# Patient Record
Sex: Female | Born: 1995 | Race: White | Hispanic: No | Marital: Single | Smoking: Current some day smoker
Health system: Southern US, Community
[De-identification: ages and names within clinical notes are randomized; demographics above are authoritative.]

## PROBLEM LIST (undated history)

## (undated) ENCOUNTER — Inpatient Hospital Stay (HOSPITAL_COMMUNITY): Payer: Self-pay

## (undated) DIAGNOSIS — F32A Depression, unspecified: Secondary | ICD-10-CM

## (undated) DIAGNOSIS — F1190 Opioid use, unspecified, uncomplicated: Secondary | ICD-10-CM

## (undated) DIAGNOSIS — F1910 Other psychoactive substance abuse, uncomplicated: Secondary | ICD-10-CM

## (undated) DIAGNOSIS — Z8614 Personal history of Methicillin resistant Staphylococcus aureus infection: Secondary | ICD-10-CM

## (undated) DIAGNOSIS — L309 Dermatitis, unspecified: Secondary | ICD-10-CM

## (undated) DIAGNOSIS — F419 Anxiety disorder, unspecified: Secondary | ICD-10-CM

## (undated) DIAGNOSIS — F909 Attention-deficit hyperactivity disorder, unspecified type: Secondary | ICD-10-CM

## (undated) DIAGNOSIS — F329 Major depressive disorder, single episode, unspecified: Secondary | ICD-10-CM

## (undated) DIAGNOSIS — R51 Headache: Secondary | ICD-10-CM

## (undated) HISTORY — DX: Attention-deficit hyperactivity disorder, unspecified type: F90.9

## (undated) SURGERY — Surgical Case
Anesthesia: Regional

---
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
## 2014-03-14 ENCOUNTER — Other Ambulatory Visit: Payer: Self-pay | Admitting: Obstetrics and Gynecology

## 2014-03-14 DIAGNOSIS — O09299 Supervision of pregnancy with other poor reproductive or obstetric history, unspecified trimester: Secondary | ICD-10-CM

## 2014-03-15 ENCOUNTER — Ambulatory Visit (INDEPENDENT_AMBULATORY_CARE_PROVIDER_SITE_OTHER): Payer: Medicaid Other

## 2014-03-15 ENCOUNTER — Other Ambulatory Visit: Payer: Self-pay | Admitting: Obstetrics and Gynecology

## 2014-03-15 DIAGNOSIS — O0932 Supervision of pregnancy with insufficient antenatal care, second trimester: Secondary | ICD-10-CM

## 2014-03-15 DIAGNOSIS — O09299 Supervision of pregnancy with other poor reproductive or obstetric history, unspecified trimester: Secondary | ICD-10-CM

## 2014-03-15 DIAGNOSIS — O0930 Supervision of pregnancy with insufficient antenatal care, unspecified trimester: Secondary | ICD-10-CM

## 2014-03-15 NOTE — Progress Notes (Signed)
U/S(18+6wks)-Limited u/s for dates-active fetus, FHR-153 bpm, meas c/w LMP dates, cx appears closed, bilateral adnexa appears WNL, anterior Gr 0 placenta, female fetus, will return for complete anatomy survey

## 2014-03-21 ENCOUNTER — Encounter: Payer: Self-pay | Admitting: Advanced Practice Midwife

## 2014-03-21 ENCOUNTER — Ambulatory Visit (INDEPENDENT_AMBULATORY_CARE_PROVIDER_SITE_OTHER): Payer: Medicaid Other | Admitting: Advanced Practice Midwife

## 2014-03-21 VITALS — BP 94/58 | Wt 134.0 lb

## 2014-03-21 DIAGNOSIS — O0930 Supervision of pregnancy with insufficient antenatal care, unspecified trimester: Secondary | ICD-10-CM

## 2014-03-21 DIAGNOSIS — Z36 Encounter for antenatal screening of mother: Secondary | ICD-10-CM

## 2014-03-21 DIAGNOSIS — Z1159 Encounter for screening for other viral diseases: Secondary | ICD-10-CM

## 2014-03-21 DIAGNOSIS — Z0189 Encounter for other specified special examinations: Secondary | ICD-10-CM

## 2014-03-21 DIAGNOSIS — O0932 Supervision of pregnancy with insufficient antenatal care, second trimester: Secondary | ICD-10-CM | POA: Insufficient documentation

## 2014-03-21 DIAGNOSIS — Z1389 Encounter for screening for other disorder: Secondary | ICD-10-CM

## 2014-03-21 DIAGNOSIS — Z1371 Encounter for nonprocreative screening for genetic disease carrier status: Secondary | ICD-10-CM

## 2014-03-21 DIAGNOSIS — O09899 Supervision of other high risk pregnancies, unspecified trimester: Secondary | ICD-10-CM | POA: Insufficient documentation

## 2014-03-21 DIAGNOSIS — F17200 Nicotine dependence, unspecified, uncomplicated: Secondary | ICD-10-CM

## 2014-03-21 DIAGNOSIS — Z3490 Encounter for supervision of normal pregnancy, unspecified, unspecified trimester: Secondary | ICD-10-CM

## 2014-03-21 DIAGNOSIS — O09299 Supervision of pregnancy with other poor reproductive or obstetric history, unspecified trimester: Secondary | ICD-10-CM | POA: Insufficient documentation

## 2014-03-21 DIAGNOSIS — Z331 Pregnant state, incidental: Secondary | ICD-10-CM

## 2014-03-21 DIAGNOSIS — Z3482 Encounter for supervision of other normal pregnancy, second trimester: Secondary | ICD-10-CM

## 2014-03-21 DIAGNOSIS — Z0184 Encounter for antibody response examination: Secondary | ICD-10-CM

## 2014-03-21 DIAGNOSIS — O09292 Supervision of pregnancy with other poor reproductive or obstetric history, second trimester: Secondary | ICD-10-CM

## 2014-03-21 LAB — POCT URINALYSIS DIPSTICK
Glucose, UA: NEGATIVE
Ketones, UA: NEGATIVE
LEUKOCYTES UA: NEGATIVE
Nitrite, UA: NEGATIVE
PROTEIN UA: NEGATIVE
RBC UA: NEGATIVE

## 2014-03-21 LAB — CBC
HCT: 37.8 % (ref 36.0–46.0)
Hemoglobin: 12.8 g/dL (ref 12.0–15.0)
MCH: 29.9 pg (ref 26.0–34.0)
MCHC: 33.9 g/dL (ref 30.0–36.0)
MCV: 88.3 fL (ref 78.0–100.0)
PLATELETS: 290 10*3/uL (ref 150–400)
RBC: 4.28 MIL/uL (ref 3.87–5.11)
RDW: 13.8 % (ref 11.5–15.5)
WBC: 12.6 10*3/uL — AB (ref 4.0–10.5)

## 2014-03-21 MED ORDER — PRENATAL VITAMINS 0.8 MG PO TABS: 1.0000 | ORAL_TABLET | Freq: Every day | ORAL | Status: DC

## 2014-03-21 NOTE — Progress Notes (Signed)
Subjective:    Carmen Martin is a [id number] [redacted]w[redacted]d being seen today for her first obstetrical visit.  Her obstetrical history is significant for IUFD somewhere around 30-32 weeks. Had IOL and SVD w/o complications.  Pregnancy history fully reviewed. She is late to care this pregnancy at 18 weeks.  Patient reports fatigue.  Has cut back to 1/2 ppweek of cigarettes (form 1 ppd) over the past 2 weeks  Filed Vitals:   03-23-14 1428  BP: 94/58  Weight: 134 lb (60.782 kg)    HISTORY: OB History  Gravida Para Term Preterm AB SAB TAB Ectopic Multiple Living     # Outcome Date GA Lbr Len/2nd Weight Sex Delivery Anes PTL Lv  3 CUR           2 PRE 01/23/12 [redacted]w[redacted]d 05:00 / 00:03 2 lb (0.907 kg) M SVD EPI  SB  1 SAB              Past Medical History  Diagnosis Date  . Anxiety   . Headache(784.0)   . Depression   . ADD (attention deficit disorder)   . Hx MRSA infection     in buttocks  . ADHD (attention deficit hyperactivity disorder) 11/02/2012   Past Surgical History  Procedure Laterality Date  . No past surgeries    . Wisdom tooth extraction  2014  . Colonoscopy with propofol N/A 09/29/2012    Procedure: COLONOSCOPY WITH PROPOFOL;  Surgeon: Malissa Hippo, MD;  Location: [facility];  Service: Endoscopy;  Laterality: N/A;  at cecum at 0902 total withdrawal time   Family History  Problem Relation Age of Onset  . Diabetes Mother   . Heart disease Mother   . Stroke Mother   . Hypertension Mother   . Depression Father   . Cancer Maternal Grandmother     breast     Exam                                           Skin: normal coloration and turgor, no rashes    Neurologic: oriented, normal, normal mood   Extremities: normal strength, tone, and muscle mass   HEENT PERRLA   Mouth/Teeth mucous membranes moist, poor dentition   Neck supple and no masses   Cardiovascular: regular rate and rhythm   Respiratory:  appears well, vitals normal, no  respiratory distress, acyanotic   Abdomen: soft, non-tender;  FHR: 150          Assessment:    Pregnancy: [id number] Patient Active Problem List   Diagnosis Date Noted  . Pregnant 23-Mar-2014  . Late prenatal care in second trimester 03-23-14  . History of intrauterine fetal death, currently pregnant 03-23-2014  . ADHD (attention deficit hyperactivity disorder) 11/02/2012  . Anxiety state, unspecified 09/22/2012  . Depression 09/22/2012  . Rectal bleeding 09/22/2012  . Nausea 09/22/2012  . Abdominal pain, epigastric 09/22/2012        Plan:     Initial labs drawn. Continue prenatal vitamins  Problem list reviewed and updated  Smoking cessation discussed:  Will probably try e cigs Reviewed recommended weight gain based on pre-gravid BMI  Encouraged well-balanced diet Genetic Screening discussed Quad Screen: requested.  Ultrasound discussed; fetal survey: requested.  Follow up 9/29 as scheduled for anatomy scan.  CRESENZO-DISHMAN,Berneita Sanagustin 2014/03/23

## 2014-03-22 LAB — TSH: TSH: 0.729 u[IU]/mL (ref 0.350–4.500)

## 2014-03-22 LAB — URINALYSIS, ROUTINE W REFLEX MICROSCOPIC
BILIRUBIN URINE: NEGATIVE
GLUCOSE, UA: NEGATIVE mg/dL
Hgb urine dipstick: NEGATIVE
KETONES UR: NEGATIVE mg/dL
Leukocytes, UA: NEGATIVE
Nitrite: NEGATIVE
PH: 6.5 (ref 5.0–8.0)
Protein, ur: NEGATIVE mg/dL
SPECIFIC GRAVITY, URINE: 1.027 (ref 1.005–1.030)
Urobilinogen, UA: 1 mg/dL (ref 0.0–1.0)

## 2014-03-22 LAB — DRUG SCREEN, URINE, NO CONFIRMATION
AMPHETAMINE SCRN UR: NEGATIVE
BARBITURATE QUANT UR: NEGATIVE
Benzodiazepines.: POSITIVE — AB
COCAINE METABOLITES: NEGATIVE
Creatinine,U: 403.4 mg/dL
MARIJUANA METABOLITE: NEGATIVE
METHADONE: NEGATIVE
OPIATE SCREEN, URINE: NEGATIVE
Phencyclidine (PCP): NEGATIVE
Propoxyphene: NEGATIVE

## 2014-03-22 LAB — AFP, QUAD SCREEN
AFP: 60.8 ng/mL
CURR GEST AGE: 19.5 wks.days
HCG, Total: 11.22 IU/mL
INH: 167.6 pg/mL
Interpretation-AFP: NEGATIVE
MOM FOR INH: 0.83
MoM for AFP: 1.03
MoM for hCG: 0.46
Open Spina bifida: NEGATIVE
Tri 18 Scr Risk Est: NEGATIVE
UE3 VALUE: 2.34 ng/mL
uE3 Mom: 1.35

## 2014-03-22 LAB — CYSTIC FIBROSIS DIAGNOSTIC STUDY

## 2014-03-22 LAB — RPR

## 2014-03-22 LAB — HIV ANTIBODY (ROUTINE TESTING W REFLEX): HIV 1&2 Ab, 4th Generation: NONREACTIVE

## 2014-03-22 LAB — ANTIBODY SCREEN: ANTIBODY SCREEN: NEGATIVE

## 2014-03-22 LAB — OXYCODONE SCREEN, UA, RFLX CONFIRM: Oxycodone Screen, Ur: NEGATIVE ng/mL

## 2014-03-22 LAB — RUBELLA SCREEN: RUBELLA: 1.14 {index} — AB (ref <0.90)

## 2014-03-22 LAB — VARICELLA ZOSTER ANTIBODY, IGG: VARICELLA IGG: 39.46 {index} (ref <135.00)

## 2014-03-22 LAB — HEPATITIS B SURFACE ANTIGEN: Hepatitis B Surface Ag: NEGATIVE

## 2014-03-23 ENCOUNTER — Encounter: Payer: Self-pay | Admitting: Advanced Practice Midwife

## 2014-03-23 LAB — URINE CULTURE
Colony Count: NO GROWTH
ORGANISM ID, BACTERIA: NO GROWTH

## 2014-03-27 ENCOUNTER — Other Ambulatory Visit: Payer: Self-pay | Admitting: Advanced Practice Midwife

## 2014-03-27 DIAGNOSIS — O09299 Supervision of pregnancy with other poor reproductive or obstetric history, unspecified trimester: Secondary | ICD-10-CM

## 2014-03-30 ENCOUNTER — Inpatient Hospital Stay (HOSPITAL_COMMUNITY)
Admission: AD | Admit: 2014-03-30 | Discharge: 2014-03-30 | Disposition: A | Discharge status: Home / Self Care | Payer: Medicaid Other | Source: Ambulatory Visit | Attending: Obstetrics & Gynecology | Admitting: Obstetrics & Gynecology

## 2014-03-30 ENCOUNTER — Encounter (HOSPITAL_COMMUNITY): Payer: Self-pay

## 2014-03-30 DIAGNOSIS — M545 Low back pain, unspecified: Secondary | ICD-10-CM | POA: Diagnosis not present

## 2014-03-30 DIAGNOSIS — R109 Unspecified abdominal pain: Secondary | ICD-10-CM

## 2014-03-30 DIAGNOSIS — O26899 Other specified pregnancy related conditions, unspecified trimester: Secondary | ICD-10-CM

## 2014-03-30 DIAGNOSIS — Z3490 Encounter for supervision of normal pregnancy, unspecified, unspecified trimester: Secondary | ICD-10-CM

## 2014-03-30 DIAGNOSIS — O99330 Smoking (tobacco) complicating pregnancy, unspecified trimester: Secondary | ICD-10-CM | POA: Diagnosis not present

## 2014-03-30 DIAGNOSIS — O0932 Supervision of pregnancy with insufficient antenatal care, second trimester: Secondary | ICD-10-CM

## 2014-03-30 DIAGNOSIS — O99891 Other specified diseases and conditions complicating pregnancy: Secondary | ICD-10-CM | POA: Diagnosis not present

## 2014-03-30 DIAGNOSIS — O09299 Supervision of pregnancy with other poor reproductive or obstetric history, unspecified trimester: Secondary | ICD-10-CM

## 2014-03-30 DIAGNOSIS — O9989 Other specified diseases and conditions complicating pregnancy, childbirth and the puerperium: Principal | ICD-10-CM

## 2014-03-30 LAB — URINALYSIS, ROUTINE W REFLEX MICROSCOPIC
Bilirubin Urine: NEGATIVE
Glucose, UA: NEGATIVE mg/dL
Hgb urine dipstick: NEGATIVE
Ketones, ur: NEGATIVE mg/dL
LEUKOCYTES UA: NEGATIVE
Nitrite: NEGATIVE
PROTEIN: NEGATIVE mg/dL
Specific Gravity, Urine: 1.015 (ref 1.005–1.030)
UROBILINOGEN UA: 0.2 mg/dL (ref 0.0–1.0)
pH: 7 (ref 5.0–8.0)

## 2014-03-30 LAB — LIPASE, BLOOD: Lipase: 15 U/L (ref 11–59)

## 2014-03-30 LAB — CBC
HCT: 32.9 % — ABNORMAL LOW (ref 36.0–46.0)
Hemoglobin: 11.1 g/dL — ABNORMAL LOW (ref 12.0–15.0)
MCH: 30.2 pg (ref 26.0–34.0)
MCHC: 33.7 g/dL (ref 30.0–36.0)
MCV: 89.4 fL (ref 78.0–100.0)
PLATELETS: 190 10*3/uL (ref 150–400)
RBC: 3.68 MIL/uL — ABNORMAL LOW (ref 3.87–5.11)
RDW: 13.5 % (ref 11.5–15.5)
WBC: 12.2 10*3/uL — AB (ref 4.0–10.5)

## 2014-03-30 MED ORDER — CYCLOBENZAPRINE HCL 5 MG PO TABS: 5.0000 mg | ORAL_TABLET | Freq: Once | ORAL | Status: DC

## 2014-03-30 MED ORDER — CYCLOBENZAPRINE HCL 5 MG PO TABS
5.0000 mg | ORAL_TABLET | Freq: Once | ORAL | Status: DC
  Filled 2014-03-30: qty 1

## 2014-03-30 NOTE — MAU Provider Note (Signed)
First Provider Initiated Contact with Patient 03/30/14 [phone]      Chief Complaint:  Abdominal Pain, Shoulder Pain and Back Pain   Carmen Martin is  18 y.o. G3P0110 at [redacted]w[redacted]d presents complaining of Abdominal Pain, Shoulder Pain and Back Pain Pain woke the patient up at 1:45 due to abdominal and back pain.  The pain is located in the lower abdomen bilaterally and lower back bilaterally.  Abdominal pain is sharp and pinching in nature.  Standing makes the pain worse. She notes the pain was a 7-8/10 but has now improved to a 5/10. The pain is now only on the right side. She denies N/V. Last ate last night. No dysuria, urinary urgency, urinary frequency. No vaginal discharge/pruritus. No contractions.  Additionally, the patient endorses back pain that is an 8/10 constantly which started last night. It is worsened by movement/bending over. No injury/trauma.   She had a few sharp pains between her shoulder blades last night that resolved quickly. No current pains. Denies SOB or chest pain.   Patient notes fetal movement. No LOF, vaginal bleeding.  Obstetrical/Gynecological History: OB History   Grav Para Term Preterm Abortions TAB SAB Ect Mult Living       Past Medical History: Past Medical History  Diagnosis Date  . Anxiety   . Headache(784.0)   . Depression   . ADD (attention deficit disorder)   . Hx MRSA infection     in buttocks  . ADHD (attention deficit hyperactivity disorder) 11/02/2012    Past Surgical History: Past Surgical History  Procedure Laterality Date  . No past surgeries    . Wisdom tooth extraction  2014  . Colonoscopy with propofol N/A 09/29/2012    Procedure: COLONOSCOPY WITH PROPOFOL;  Surgeon: Malissa Hippo, MD;  Location: [facility];  Service: Endoscopy;  Laterality: N/A;  at cecum at 0902 total withdrawal time    Family History: Family History  Problem Relation Age of Onset  . Diabetes Mother   . Heart disease Mother   .  Stroke Mother   . Hypertension Mother   . Depression Father   . Cancer Maternal Grandmother     breast    Social History: History  Substance Use Topics  . Smoking status: Current Some Day Smoker -- 0.50 packs/day    Types: Cigarettes    Last Attempt to Quit: 07/20/2011  . Smokeless tobacco: Never Used     Comment: pt states less than 1/2 pack cig per week  . Alcohol Use: No    Allergies:  Allergies  Allergen Reactions  . Biaxin [Clarithromycin] Anaphylaxis and Swelling    Face swelling, thresh, throat closes up    Meds:  Prescriptions prior to admission  Medication Sig Dispense Refill  . Prenatal Multivit-Min-Fe-FA (PRENATAL VITAMINS) 0.8 MG tablet Take 1 tablet by mouth daily.  30 tablet  12  . Prenatal Vit-Fe Fumarate-FA (MULTIVITAMIN-PRENATAL) 27-0.8 MG TABS tablet Take 1 tablet by mouth daily at 12 noon.        Review of Systems -   Review of Systems  Constitutional: Negative for fever, chills, weight loss, malaise/fatigue and diaphoresis.  HENT: Negative for hearing loss, ear pain, nosebleeds, congestion, sore throat, neck pain, tinnitus and ear discharge.   Eyes: Negative for blurred vision, double vision, photophobia, pain, discharge and redness.  Respiratory: Negative for cough, hemoptysis, sputum production, shortness of breath, wheezing and stridor.   Cardiovascular: Negative for chest pain,  palpitations, orthopnea,  leg swelling  Gastrointestinal: Negative for heartburn, nausea, vomiting, diarrhea, constipation, blood in stool Genitourinary: Negative for dysuria, urgency, frequency, hematuria and flank pain.  Musculoskeletal: Negative for myalgias, back pain, joint pain and falls.  Skin: Negative for itching and rash.  Neurological: Negative for dizziness, tingling, tremors, sensory change, speech change, focal weakness, seizures, loss of consciousness, weakness and headaches.  Endo/Heme/Allergies: Negative for environmental allergies and polydipsia. Does not  bruise/bleed easily.  Psychiatric/Behavioral: Negative for depression, suicidal ideas, hallucinations, memory loss and substance abuse. The patient is not nervous/anxious and does not have insomnia.     Physical Exam  Blood pressure 91/54, pulse 92, temperature 98.5 F (36.9 C), temperature source Oral, resp. rate 18, last menstrual period 11/03/2013, SpO2 97.00%. GENERAL: Well-developed, well-nourished female in no acute distress.  LUNGS: Clear to auscultation bilaterally.  HEART: Regular rate and rhythm. ABDOMEN: Soft, nondistended, gravid.  During the exam, the patient does not appear to be in too much distress, however she notes pain periumbilically and on the right with palpation of the lower left abdomen. Possible rebound, but once again this is only by patient endorsement- does not appear to be in too much distress. No guarding. No pain in the epigastrium.  BACK: No spinal drop-offs.  EXTREMITIES: Nontender, no edema, 2+ distal pulses. DTR's 2+ FHT:  Baseline rate 150 bpm    Labs: Results for orders placed during the hospital encounter of 03/30/14 (from the past 24 hour(s))  URINALYSIS, ROUTINE W REFLEX MICROSCOPIC   Collection Time    03/30/14  4:15 AM      Result Value Ref Range   Color, Urine YELLOW  YELLOW   APPearance HAZY (*) CLEAR   Specific Gravity, Urine 1.015  1.005 - 1.030   pH 7.0  5.0 - 8.0   Glucose, UA NEGATIVE  NEGATIVE mg/dL   Hgb urine dipstick NEGATIVE  NEGATIVE   Bilirubin Urine NEGATIVE  NEGATIVE   Ketones, ur NEGATIVE  NEGATIVE mg/dL   Protein, ur NEGATIVE  NEGATIVE mg/dL   Urobilinogen, UA 0.2  0.0 - 1.0 mg/dL   Nitrite NEGATIVE  NEGATIVE   Leukocytes, UA NEGATIVE  NEGATIVE   Imaging Studies:  US Ob Limited  03/29/2014   DATING AND VIABILITY SONOGRAM   Carmen Martin is a 18 y.o. year old G37P0130 with LMP 11/03/2013 which  would correlate to  [redacted]w[redacted]d weeks gestation.  She has regular menstrual  cycles.   She is here today for a confirmatory initial  sonogram.    GESTATION: SINGLETON     FETAL ACTIVITY:          Heart rate         153 bpm          The fetus is active.  PLACENTA LOCALIZATION:  anterior GRADE 0  CERVIX: Appears long and closed   ADNEXA: The ovaries are normal.  GESTATIONAL AGE AND  BIOMETRICS:  Gestational criteria: Estimated Date of Delivery: 08/10/14 by LMP now at  [redacted]w[redacted]d  Previous Scans:0  GESTATIONAL SAC            mm          weeks  CROWN RUMP LENGTH            mm          weeks  NUCHAL TRANSLUCENCY            mm           BIPARIETAL DIAMETER  4.27 cm         18+6 weeks  HEAD CIRCUMFERENCE           15.71 cm         18+4 weeks  ABDOMINAL CIRCUMFERENCE           13.36 cm         18+6 weeks  FEMUR LENGTH           2.68 cm         18+1 weeks                                                           AVERAGE EGA(BY THIS SCAN):   18+4 weeks                                                 ESTIMATED FETAL WEIGHT:        243  grams,  WORKING EDD( LMP ):  08/10/2014   TECHNICIAN COMMENTS:  U/S(18+6wks)-Limited u/s for dates-active fetus, FHR-153 bpm, meas c/w LMP  dates, cx appears closed, bilateral adnexa appears WNL, anterior Gr 0  placenta, female fetus, will return for complete anatomy survey  A copy of this report including all images has been saved and backed up to  a second source for retrieval if needed. All measures and details of the  anatomical scan, placentation, fluid volume and pelvic anatomy are  contained in that report.  Chari Manning 03/15/2014 3:14 PM  Clinical Impression and recommendations:  I have reviewed the sonogram results above, combined with the patient's  current clinical course, below are my impressions and any appropriate  recommendations for management based on the sonographic findings.   1.  [id number] Estimated Date of Delivery: 08/10/14  by today's sonographic  dating 2.  Normal fetal sonographic findings,  3.  Normal general sonographic findings  Recommend routine prenatal care based on this sonogram or as clinically   indicated  EURE,LUTHER H 03/15/2014      Assessment: Carmen Martin is  18 y.o. G3P0110 at [redacted]w[redacted]d presents with abdominal pain bilaterally that is now on the right lower quadrant. No urinary symptoms and U/A negative. This most likely represents round ligament pain.  As far as her lower back pain, this seems more musculoskeletal in nature. Lipase within normal limits. Good fetal heart rate noted in triage.  Plan: -Discharge home -Flexeril PRN back pain -Labor precautions and kick counts discussed.    Joanna Puff 9/24/20155:06 AM  I have seen and examined this patient and I agree with the above. Cam Hai CNM 10:03 PM 04/05/2014

## 2014-03-30 NOTE — MAU Note (Signed)
Pain in back, abd, and between shoulder blades woke her up at 0145.  Denies bleeding, denies leaking. Last baby movement at 10:30 pm, baby moving as usual today.

## 2014-03-30 NOTE — Discharge Instructions (Signed)

## 2014-04-04 ENCOUNTER — Ambulatory Visit (INDEPENDENT_AMBULATORY_CARE_PROVIDER_SITE_OTHER): Payer: Medicaid Other

## 2014-04-04 ENCOUNTER — Other Ambulatory Visit: Payer: Self-pay | Admitting: Advanced Practice Midwife

## 2014-04-04 DIAGNOSIS — O09299 Supervision of pregnancy with other poor reproductive or obstetric history, unspecified trimester: Secondary | ICD-10-CM

## 2014-04-04 DIAGNOSIS — O3090 Multiple gestation, unspecified, unspecified trimester: Secondary | ICD-10-CM

## 2014-04-04 DIAGNOSIS — O355XX0 Maternal care for (suspected) damage to fetus by drugs, not applicable or unspecified: Secondary | ICD-10-CM

## 2014-04-04 DIAGNOSIS — O355XX1 Maternal care for (suspected) damage to fetus by drugs, fetus 1: Secondary | ICD-10-CM

## 2014-04-04 NOTE — Progress Notes (Signed)
U/S(21+5wks)-active fetus, meas c/w dates, fluid wnl, anterior Gr 0 placenta, cx appears closed (3.1cm), bilateral adnexa appears WNL, FHR-144 bpm, female fetus, no major abnl noted

## 2014-04-11 ENCOUNTER — Ambulatory Visit (INDEPENDENT_AMBULATORY_CARE_PROVIDER_SITE_OTHER): Payer: Medicaid Other | Admitting: Advanced Practice Midwife

## 2014-04-11 ENCOUNTER — Encounter: Payer: Self-pay | Admitting: Advanced Practice Midwife

## 2014-04-11 VITALS — BP 100/66 | Wt 134.0 lb

## 2014-04-11 DIAGNOSIS — Z3490 Encounter for supervision of normal pregnancy, unspecified, unspecified trimester: Secondary | ICD-10-CM

## 2014-04-11 DIAGNOSIS — Z1389 Encounter for screening for other disorder: Secondary | ICD-10-CM

## 2014-04-11 DIAGNOSIS — Z331 Pregnant state, incidental: Secondary | ICD-10-CM

## 2014-04-11 DIAGNOSIS — Z3482 Encounter for supervision of other normal pregnancy, second trimester: Secondary | ICD-10-CM

## 2014-04-11 DIAGNOSIS — O09292 Supervision of pregnancy with other poor reproductive or obstetric history, second trimester: Secondary | ICD-10-CM

## 2014-04-11 DIAGNOSIS — O0932 Supervision of pregnancy with insufficient antenatal care, second trimester: Secondary | ICD-10-CM

## 2014-04-11 LAB — POCT URINALYSIS DIPSTICK
GLUCOSE UA: NEGATIVE
Ketones, UA: NEGATIVE
Leukocytes, UA: NEGATIVE
Nitrite, UA: NEGATIVE
Protein, UA: NEGATIVE
RBC UA: NEGATIVE

## 2014-04-11 MED ORDER — CYCLOBENZAPRINE HCL 5 MG PO TABS: 5.0000 mg | ORAL_TABLET | Freq: Once | ORAL | Status: DC

## 2014-04-11 NOTE — Progress Notes (Signed)
[id number] 4948w5d Estimated Date of Delivery: 08/10/14  Blood pressure 100/66, weight 134 lb (60.782 kg), last menstrual period 11/03/2013.   BP weight and urine results all reviewed and noted.  Please refer to the obstetrical flow sheet for the fundal height and fetal heart rate documentation:  Patient reports good fetal movement, denies any bleeding and no rupture of membranes symptoms or regular contractions. Patient is without complaints except normal pregnancy co.  Flexeril helps back pain so All questions were answered.  Plan:  Continued routine obstetrical care,   Follow up in 3 weeks for OB appointment,

## 2014-04-11 NOTE — Progress Notes (Signed)
Pt states that she has been having lower back and round ligament pain.

## 2014-05-03 ENCOUNTER — Ambulatory Visit (INDEPENDENT_AMBULATORY_CARE_PROVIDER_SITE_OTHER): Payer: Medicaid Other | Admitting: Advanced Practice Midwife

## 2014-05-03 VITALS — BP 100/60 | Wt 141.0 lb

## 2014-05-03 DIAGNOSIS — Z331 Pregnant state, incidental: Secondary | ICD-10-CM

## 2014-05-03 DIAGNOSIS — Z3493 Encounter for supervision of normal pregnancy, unspecified, third trimester: Secondary | ICD-10-CM

## 2014-05-03 DIAGNOSIS — O0932 Supervision of pregnancy with insufficient antenatal care, second trimester: Secondary | ICD-10-CM

## 2014-05-03 DIAGNOSIS — O09292 Supervision of pregnancy with other poor reproductive or obstetric history, second trimester: Secondary | ICD-10-CM

## 2014-05-03 DIAGNOSIS — Z1389 Encounter for screening for other disorder: Secondary | ICD-10-CM

## 2014-05-03 LAB — POCT URINALYSIS DIPSTICK
Blood, UA: NEGATIVE
GLUCOSE UA: NEGATIVE
KETONES UA: NEGATIVE
Leukocytes, UA: NEGATIVE
Nitrite, UA: NEGATIVE
Protein, UA: NEGATIVE

## 2014-05-03 NOTE — Progress Notes (Signed)
[id number] 564w6d Estimated Date of Delivery: 08/10/14  Blood pressure 100/60, weight 141 lb (63.957 kg), last menstrual period 11/03/2013.   BP weight and urine results all reviewed and noted.  Please refer to the obstetrical flow sheet for the fundal height and fetal heart rate documentation: FH is less than dates.  Will check US  Patient reports good fetal movement, denies any bleeding and no rupture of membranes symptoms or regular contractions. Patient is without complaints other than normal pregnancy complaints All questions were answered.  Plan:  Continued routine obstetrical care,   Follow up in 1 weeks for OB appointment, PN2 and us to recheck anatomy, EFW/AFI

## 2014-05-08 ENCOUNTER — Encounter: Payer: Self-pay | Admitting: Advanced Practice Midwife

## 2014-05-09 ENCOUNTER — Ambulatory Visit (INDEPENDENT_AMBULATORY_CARE_PROVIDER_SITE_OTHER): Payer: Medicaid Other

## 2014-05-09 ENCOUNTER — Encounter: Payer: Self-pay | Admitting: Advanced Practice Midwife

## 2014-05-09 ENCOUNTER — Ambulatory Visit (INDEPENDENT_AMBULATORY_CARE_PROVIDER_SITE_OTHER): Payer: Medicaid Other | Admitting: Advanced Practice Midwife

## 2014-05-09 ENCOUNTER — Other Ambulatory Visit: Payer: Medicaid Other

## 2014-05-09 ENCOUNTER — Other Ambulatory Visit: Payer: Self-pay | Admitting: Advanced Practice Midwife

## 2014-05-09 VITALS — BP 100/60 | Wt 140.0 lb

## 2014-05-09 DIAGNOSIS — Z131 Encounter for screening for diabetes mellitus: Secondary | ICD-10-CM

## 2014-05-09 DIAGNOSIS — O355XX1 Maternal care for (suspected) damage to fetus by drugs, fetus 1: Secondary | ICD-10-CM

## 2014-05-09 DIAGNOSIS — Z1389 Encounter for screening for other disorder: Secondary | ICD-10-CM

## 2014-05-09 DIAGNOSIS — O09292 Supervision of pregnancy with other poor reproductive or obstetric history, second trimester: Secondary | ICD-10-CM

## 2014-05-09 DIAGNOSIS — Z113 Encounter for screening for infections with a predominantly sexual mode of transmission: Secondary | ICD-10-CM

## 2014-05-09 DIAGNOSIS — Z0184 Encounter for antibody response examination: Secondary | ICD-10-CM

## 2014-05-09 DIAGNOSIS — Z3482 Encounter for supervision of other normal pregnancy, second trimester: Secondary | ICD-10-CM

## 2014-05-09 DIAGNOSIS — Z331 Pregnant state, incidental: Secondary | ICD-10-CM

## 2014-05-09 DIAGNOSIS — Z114 Encounter for screening for human immunodeficiency virus [HIV]: Secondary | ICD-10-CM

## 2014-05-09 DIAGNOSIS — Z3493 Encounter for supervision of normal pregnancy, unspecified, third trimester: Secondary | ICD-10-CM

## 2014-05-09 DIAGNOSIS — O0932 Supervision of pregnancy with insufficient antenatal care, second trimester: Secondary | ICD-10-CM

## 2014-05-09 DIAGNOSIS — O0992 Supervision of high risk pregnancy, unspecified, second trimester: Secondary | ICD-10-CM

## 2014-05-09 LAB — POCT URINALYSIS DIPSTICK
Glucose, UA: NEGATIVE
Ketones, UA: NEGATIVE
Leukocytes, UA: NEGATIVE
NITRITE UA: NEGATIVE
Protein, UA: NEGATIVE
RBC UA: NEGATIVE

## 2014-05-09 LAB — CBC
HEMATOCRIT: 34.6 % — AB (ref 36.0–46.0)
HEMOGLOBIN: 11.9 g/dL — AB (ref 12.0–15.0)
MCH: 29.9 pg (ref 26.0–34.0)
MCHC: 34.4 g/dL (ref 30.0–36.0)
MCV: 86.9 fL (ref 78.0–100.0)
Platelets: 237 10*3/uL (ref 150–400)
RBC: 3.98 MIL/uL (ref 3.87–5.11)
RDW: 13.6 % (ref 11.5–15.5)
WBC: 11.5 10*3/uL — AB (ref 4.0–10.5)

## 2014-05-09 MED ORDER — CYCLOBENZAPRINE HCL 10 MG PO TABS: 10.0000 mg | ORAL_TABLET | Freq: Three times a day (TID) | ORAL | Status: DC | PRN

## 2014-05-09 NOTE — Progress Notes (Signed)
High Risk Pregnancy Diagnosis(es):   History IUFD ~ 32 weeks  G3P0110 8171w5d Estimated Date of Delivery: 08/10/14  Blood pressure 100/60, weight 140 lb (63.504 kg), last menstrual period 11/03/2013.  Urinalysis: Negative   HPI:      Had BPP today: all normal BP weight and urine results all reviewed and noted. Patient reports good fetal movement, denies any bleeding and no rupture of membranes symptoms or regular contractions.  Fundal Height:  28% EFW Fetal Heart rate:  149 Edema:  no  Patient is without complaints. All questions were answered.  She is doing her sugar test today  Assessment:  6971w5d,   Hx IUFD  Medication(s) Plans:  none  Treatment Plan:  weeklly BPP 28-32 weeks, then NST 2x/week per MFM guidelines  Follow up in 10 days for OB appt, BPP

## 2014-05-09 NOTE — Progress Notes (Signed)
U/S(26+5wks)-vtx active fetus, FHR-136 bpm, female fetus, EFW 1 lb 12 oz (28th%tile), fluid WNL SDP-4.3cm, cx appears closed although unable to measure due to fetal position, anterior Gr 1 placenta

## 2014-05-09 NOTE — Progress Notes (Signed)
Pt denies any problems or concerns at this time.  

## 2014-05-10 LAB — GLUCOSE TOLERANCE, 2 HOURS W/ 1HR
GLUCOSE, 2 HOUR: 80 mg/dL (ref 70–139)
GLUCOSE, FASTING: 68 mg/dL — AB (ref 70–99)
GLUCOSE: 84 mg/dL (ref 70–170)

## 2014-05-10 LAB — HSV 2 ANTIBODY, IGG

## 2014-05-10 LAB — RPR

## 2014-05-10 LAB — ANTIBODY SCREEN: ANTIBODY SCREEN: NEGATIVE

## 2014-05-10 LAB — HIV ANTIBODY (ROUTINE TESTING W REFLEX): HIV 1&2 Ab, 4th Generation: NONREACTIVE

## 2014-05-11 ENCOUNTER — Telehealth: Payer: Self-pay | Admitting: Adult Health

## 2014-05-11 NOTE — Telephone Encounter (Signed)
Left message to call [facility]

## 2014-05-16 ENCOUNTER — Other Ambulatory Visit: Payer: Self-pay | Admitting: Advanced Practice Midwife

## 2014-05-16 DIAGNOSIS — O99320 Drug use complicating pregnancy, unspecified trimester: Secondary | ICD-10-CM

## 2014-05-16 DIAGNOSIS — O09893 Supervision of other high risk pregnancies, third trimester: Secondary | ICD-10-CM

## 2014-05-16 DIAGNOSIS — F1910 Other psychoactive substance abuse, uncomplicated: Secondary | ICD-10-CM

## 2014-05-17 ENCOUNTER — Encounter: Payer: Self-pay | Admitting: Advanced Practice Midwife

## 2014-05-18 ENCOUNTER — Ambulatory Visit (INDEPENDENT_AMBULATORY_CARE_PROVIDER_SITE_OTHER): Payer: Medicaid Other | Admitting: Advanced Practice Midwife

## 2014-05-18 ENCOUNTER — Other Ambulatory Visit: Payer: Self-pay | Admitting: Advanced Practice Midwife

## 2014-05-18 ENCOUNTER — Ambulatory Visit (INDEPENDENT_AMBULATORY_CARE_PROVIDER_SITE_OTHER): Payer: Medicaid Other

## 2014-05-18 VITALS — BP 90/60 | Wt 143.0 lb

## 2014-05-18 DIAGNOSIS — O09293 Supervision of pregnancy with other poor reproductive or obstetric history, third trimester: Secondary | ICD-10-CM

## 2014-05-18 DIAGNOSIS — O99320 Drug use complicating pregnancy, unspecified trimester: Secondary | ICD-10-CM

## 2014-05-18 DIAGNOSIS — F1910 Other psychoactive substance abuse, uncomplicated: Secondary | ICD-10-CM

## 2014-05-18 DIAGNOSIS — Z79899 Other long term (current) drug therapy: Secondary | ICD-10-CM | POA: Insufficient documentation

## 2014-05-18 DIAGNOSIS — Z1389 Encounter for screening for other disorder: Secondary | ICD-10-CM

## 2014-05-18 DIAGNOSIS — Z331 Pregnant state, incidental: Secondary | ICD-10-CM

## 2014-05-18 DIAGNOSIS — O0933 Supervision of pregnancy with insufficient antenatal care, third trimester: Secondary | ICD-10-CM

## 2014-05-18 DIAGNOSIS — F1320 Sedative, hypnotic or anxiolytic dependence, uncomplicated: Secondary | ICD-10-CM

## 2014-05-18 DIAGNOSIS — O09893 Supervision of other high risk pregnancies, third trimester: Secondary | ICD-10-CM

## 2014-05-18 LAB — POCT URINALYSIS DIPSTICK
GLUCOSE UA: NEGATIVE
Ketones, UA: NEGATIVE
Leukocytes, UA: NEGATIVE
Nitrite, UA: NEGATIVE
Protein, UA: NEGATIVE
RBC UA: NEGATIVE

## 2014-05-18 NOTE — Progress Notes (Signed)
High Risk Pregnancy Diagnosis(es):   Hx IUFD ~ 28 weekds  G3P0110 3249w0d Estimated Date of Delivery: 08/10/14  Blood pressure 90/60, weight 143 lb (64.864 kg), last menstrual period 11/03/2013.  Urinalysis: Negative   HPI: Hx IUFD ~ 28 weeks, unknown reason. BPP today 8/8     Pharmacist called office the other day to let us know that pt has been receiving Xanax 2mg  TID and adderal XR 30mg  qd the whole pregnancy.  Pt denied use to the RN when her medications were discussed.  After I told her that the pharmacist had called us, she admitted to use.  When asked if Dr. Betti Cruzeddy knows that she is pregnant, she stated  No, she hasnt' seen him since August and is not scheduled to see him again until January.  I discussed at length the need to be honest with us about medications and the ramifications xanax in particular will have on the baby.  I specifically discussed that she cannot stop taking Xanax, as seizures and possibly death could occur.  She does not seem interested in weaning down, however, as she has been on Xanax since she was 18 years old. FOB DOES NOT KNOW ABOUT THE Prudy FeelerXANAX!!! Encourage pt to tell him, as it will probably become evident once baby is born BP weight and urine results all reviewed and noted. Patient reports good fetal movement, denies any bleeding and no rupture of membranes symptoms or regular contractions.  Patient is without complaints. All questions were answered.  Assessment:  4649w0d,   Hx IUFD  Medication(s) Plans:  Encouraged to see Dr. Betti Cruzeddy and work on a supervised regimen to decrease fetal exposure to Xanax/adderal.  Specifically instructed not to stop/wean on her own.   Treatment Plan:  Refer to [facility] team to go over withdrawal procedure for the baby. COntinue weekly BPP (plan growth us next week) until 32 weeks, then twice weekly testing  Follow up in 1 weeks for OB appt, growth us/bpp

## 2014-05-18 NOTE — Progress Notes (Signed)
U/S(28+0wks)-active fetus, frank breech presentation, cx appears closed (3.1cm), FHR-172 bpm, AFI-WNL =13.2 & 10.0cm SDP-3.9 & 4.2cm, BPP 8/8, anterior Gr 1 placenta

## 2014-05-19 LAB — DRUG SCREEN, URINE, NO CONFIRMATION
AMPHETAMINE SCRN UR: NEGATIVE
BENZODIAZEPINES.: NEGATIVE
Barbiturate Quant, Ur: NEGATIVE
Cocaine Metabolites: NEGATIVE
Creatinine,U: 90.5 mg/dL
Marijuana Metabolite: POSITIVE — AB
Methadone: NEGATIVE
Opiate Screen, Urine: NEGATIVE
Phencyclidine (PCP): NEGATIVE
Propoxyphene: NEGATIVE

## 2014-05-19 LAB — OXYCODONE SCREEN, UA, RFLX CONFIRM

## 2014-05-21 LAB — OPIATES/OPIOIDS (LC/MS-MS)
Codeine Urine: NEGATIVE ng/mL (ref <50)
HYDROCODONE: NEGATIVE ng/mL (ref <50)
Hydromorphone: NEGATIVE ng/mL (ref <50)
MORPHINE: NEGATIVE ng/mL (ref <50)
Norhydrocodone, Ur: NEGATIVE ng/mL (ref <50)
Noroxycodone, Ur: 2015 ng/mL — ABNORMAL HIGH (ref <50)
Oxycodone, ur: 560 ng/mL — ABNORMAL HIGH (ref <50)
Oxymorphone: 1766 ng/mL — ABNORMAL HIGH (ref <50)

## 2014-05-24 ENCOUNTER — Other Ambulatory Visit: Payer: Self-pay | Admitting: Advanced Practice Midwife

## 2014-05-24 DIAGNOSIS — O99320 Drug use complicating pregnancy, unspecified trimester: Secondary | ICD-10-CM

## 2014-05-24 DIAGNOSIS — O09293 Supervision of pregnancy with other poor reproductive or obstetric history, third trimester: Secondary | ICD-10-CM

## 2014-05-25 ENCOUNTER — Other Ambulatory Visit: Payer: Medicaid Other | Admitting: Obstetrics & Gynecology

## 2014-05-25 ENCOUNTER — Other Ambulatory Visit: Payer: Medicaid Other

## 2014-05-25 ENCOUNTER — Encounter: Payer: Medicaid Other | Admitting: Advanced Practice Midwife

## 2014-05-26 ENCOUNTER — Ambulatory Visit (INDEPENDENT_AMBULATORY_CARE_PROVIDER_SITE_OTHER): Payer: Medicaid Other | Admitting: Obstetrics & Gynecology

## 2014-05-26 ENCOUNTER — Encounter: Payer: Self-pay | Admitting: Obstetrics & Gynecology

## 2014-05-26 VITALS — BP 110/70 | Wt 143.0 lb

## 2014-05-26 DIAGNOSIS — Z1389 Encounter for screening for other disorder: Secondary | ICD-10-CM

## 2014-05-26 DIAGNOSIS — Z331 Pregnant state, incidental: Secondary | ICD-10-CM

## 2014-05-26 DIAGNOSIS — O09293 Supervision of pregnancy with other poor reproductive or obstetric history, third trimester: Secondary | ICD-10-CM

## 2014-05-26 DIAGNOSIS — O0933 Supervision of pregnancy with insufficient antenatal care, third trimester: Secondary | ICD-10-CM

## 2014-05-26 LAB — POCT URINALYSIS DIPSTICK
Blood, UA: NEGATIVE
Glucose, UA: NEGATIVE
KETONES UA: NEGATIVE
Leukocytes, UA: NEGATIVE
Nitrite, UA: NEGATIVE
PROTEIN UA: NEGATIVE

## 2014-05-26 NOTE — Progress Notes (Signed)
Reactive NST   High Risk Pregnancy Diagnosis(es):   History of IUFD  G3P0110 7253w1d Estimated Date of Delivery: 08/10/14  Blood pressure 110/70, weight 143 lb (64.864 kg), last menstrual period 11/03/2013.  Urinalysis: Negative   HPI: No complaints     BP weight and urine results all reviewed and noted. Patient reports good fetal movement, denies any bleeding and no rupture of membranes symptoms or regular contractions.  Fundal Height:  28 Fetal Heart rate:  150 Edema:  none  Patient is without complaints. All questions were answered.  Assessment:  7753w1d,   History of IUFD  Medication(s) Plans:  No changes  Treatment Plan:  Twice weekly NST with growthscans at 32, 36 weeks unless otherwise needed  Follow up in wednesday weeks for OB appt, NST

## 2014-05-26 NOTE — Addendum Note (Signed)
Addended by: Criss AlvinePULLIAM, CHRYSTAL G on: 05/26/2014 01:02 PM   Modules accepted: Orders

## 2014-05-29 ENCOUNTER — Other Ambulatory Visit: Payer: Medicaid Other

## 2014-05-31 ENCOUNTER — Encounter: Payer: Medicaid Other | Admitting: Obstetrics & Gynecology

## 2014-05-31 ENCOUNTER — Encounter: Payer: Self-pay | Admitting: Obstetrics & Gynecology

## 2014-05-31 ENCOUNTER — Ambulatory Visit (INDEPENDENT_AMBULATORY_CARE_PROVIDER_SITE_OTHER): Payer: Medicaid Other

## 2014-05-31 ENCOUNTER — Other Ambulatory Visit: Payer: Self-pay | Admitting: Advanced Practice Midwife

## 2014-05-31 ENCOUNTER — Encounter: Payer: Medicaid Other | Admitting: Advanced Practice Midwife

## 2014-05-31 ENCOUNTER — Ambulatory Visit (INDEPENDENT_AMBULATORY_CARE_PROVIDER_SITE_OTHER): Payer: Medicaid Other | Admitting: Advanced Practice Midwife

## 2014-05-31 DIAGNOSIS — Z331 Pregnant state, incidental: Secondary | ICD-10-CM

## 2014-05-31 DIAGNOSIS — IMO0002 Reserved for concepts with insufficient information to code with codable children: Secondary | ICD-10-CM | POA: Insufficient documentation

## 2014-05-31 DIAGNOSIS — O99320 Drug use complicating pregnancy, unspecified trimester: Secondary | ICD-10-CM

## 2014-05-31 DIAGNOSIS — Z1389 Encounter for screening for other disorder: Secondary | ICD-10-CM

## 2014-05-31 DIAGNOSIS — O365931 Maternal care for other known or suspected poor fetal growth, third trimester, fetus 1: Secondary | ICD-10-CM

## 2014-05-31 DIAGNOSIS — O09293 Supervision of pregnancy with other poor reproductive or obstetric history, third trimester: Secondary | ICD-10-CM

## 2014-05-31 DIAGNOSIS — O4103X1 Oligohydramnios, third trimester, fetus 1: Secondary | ICD-10-CM

## 2014-05-31 DIAGNOSIS — O288 Other abnormal findings on antenatal screening of mother: Secondary | ICD-10-CM | POA: Insufficient documentation

## 2014-05-31 DIAGNOSIS — O289 Unspecified abnormal findings on antenatal screening of mother: Secondary | ICD-10-CM

## 2014-05-31 DIAGNOSIS — O09893 Supervision of other high risk pregnancies, third trimester: Secondary | ICD-10-CM

## 2014-05-31 DIAGNOSIS — F1320 Sedative, hypnotic or anxiolytic dependence, uncomplicated: Secondary | ICD-10-CM

## 2014-05-31 LAB — POCT URINALYSIS DIPSTICK
Blood, UA: NEGATIVE
Glucose, UA: NEGATIVE
Ketones, UA: NEGATIVE
Leukocytes, UA: NEGATIVE
Nitrite, UA: NEGATIVE
PROTEIN UA: NEGATIVE

## 2014-05-31 NOTE — Progress Notes (Signed)
High Risk Pregnancy Diagnosis(es):   Hx Stillbirth, now IUGR with low fluid. Chronic Benzodiazepine use.   [id number] 2318w6d Estimated Date of Delivery: 08/10/14  Last menstrual period 11/03/2013.  Urinalysis: Negative   HPI: No complaints.  Has [facility] referral appointment 06/09/14 to discuss benzo withdrawal for the baby.   Stopping Adderall. Has appt with Dr. Betti Cruzeddy 2nd week of December.  Is now taking Xanax BID instead of TID. Encouraged to seek replacement drug (such as Buspar) while decreasing Xanax.  Pt knows that Xanax is difficult and potentially dangerous to stop, so goal would be to decrease dosage as much as is deemed safe by her psychiatrist.    BP weight and urine results all reviewed and noted. Patient reports good fetal movement, denies any bleeding and no rupture of membranes symptoms or regular contractions. Had growth us/BPP today: U/S(29+6wks)-vtx active fetus BPP 8/8, fluid Oligohydramnios by AFI of 8.6cm although SDP of 3.6cm is WNL, EFW 2 lb 2 oz (<3rd%tile [place]), FHR-141 bpm, UA Doppler RI- 0.72 & 0.71, anterior Gr 2 placenta     Patient is without complaints. All questions were answered.  Assessment:  8618w6d,   Now Oligohydramnios and IUGR  Medication(s) Plans:  No changes  Treatment Plan:  Per MFM guidelines, continue weekly BPP until 32 weeks.  Add dopplers d/t IUGR/oligohydramnios. Start weekly NST at 32 weeks alternating with BPP/Dopplers, with growth us 32-34 and 36 weeks.  Kick counts discussed  Follow up in 1 weeks for OB appt, BPP

## 2014-05-31 NOTE — Progress Notes (Signed)
U/S(29+6wks)-vtx active fetus BPP 8/8, fluid Oligohydramnios by AFI of 8.6cm although SDP of 3.6cm is WNL, EFW 2 lb 2 oz (<3rd%tile [place]), FHR-141 bpm, UA Doppler RI- 0.72 & 0.71, anterior Gr 2 placenta

## 2014-06-01 LAB — DRUG SCREEN, URINE, NO CONFIRMATION
Amphetamine Screen, Ur: NEGATIVE
BARBITURATE QUANT UR: NEGATIVE
BENZODIAZEPINES.: NEGATIVE
Cocaine Metabolites: NEGATIVE
Creatinine,U: 87.8 mg/dL
MARIJUANA METABOLITE: POSITIVE — AB
METHADONE: NEGATIVE
Opiate Screen, Urine: NEGATIVE
PROPOXYPHENE: NEGATIVE
Phencyclidine (PCP): NEGATIVE

## 2014-06-01 LAB — OXYCODONE SCREEN, UA, RFLX CONFIRM

## 2014-06-04 LAB — OPIATES/OPIOIDS (LC/MS-MS)
CODEINE URINE: NEGATIVE ng/mL (ref <50)
Hydrocodone: NEGATIVE ng/mL (ref <50)
Hydromorphone: NEGATIVE ng/mL (ref <50)
Morphine Urine: NEGATIVE ng/mL (ref <50)
NORHYDROCODONE, UR: NEGATIVE ng/mL (ref <50)
Noroxycodone, Ur: 1702 ng/mL — ABNORMAL HIGH (ref <50)
OXYCODONE, UR: 385 ng/mL — AB (ref <50)
OXYMORPHONE, URINE: 807 ng/mL — AB (ref <50)

## 2014-06-06 ENCOUNTER — Other Ambulatory Visit: Payer: Self-pay | Admitting: Advanced Practice Midwife

## 2014-06-06 DIAGNOSIS — O365931 Maternal care for other known or suspected poor fetal growth, third trimester, fetus 1: Secondary | ICD-10-CM

## 2014-06-06 LAB — US OB FOLLOW UP

## 2014-06-07 ENCOUNTER — Encounter: Payer: Self-pay | Admitting: Women's Health

## 2014-06-07 ENCOUNTER — Ambulatory Visit (INDEPENDENT_AMBULATORY_CARE_PROVIDER_SITE_OTHER): Payer: Medicaid Other | Admitting: Women's Health

## 2014-06-07 ENCOUNTER — Ambulatory Visit (INDEPENDENT_AMBULATORY_CARE_PROVIDER_SITE_OTHER): Payer: Medicaid Other

## 2014-06-07 VITALS — BP 126/74 | Wt 147.0 lb

## 2014-06-07 DIAGNOSIS — O288 Other abnormal findings on antenatal screening of mother: Secondary | ICD-10-CM

## 2014-06-07 DIAGNOSIS — O365931 Maternal care for other known or suspected poor fetal growth, third trimester, fetus 1: Secondary | ICD-10-CM

## 2014-06-07 DIAGNOSIS — Z331 Pregnant state, incidental: Secondary | ICD-10-CM

## 2014-06-07 DIAGNOSIS — Z1389 Encounter for screening for other disorder: Secondary | ICD-10-CM

## 2014-06-07 DIAGNOSIS — IMO0002 Reserved for concepts with insufficient information to code with codable children: Secondary | ICD-10-CM

## 2014-06-07 DIAGNOSIS — Z3490 Encounter for supervision of normal pregnancy, unspecified, unspecified trimester: Secondary | ICD-10-CM

## 2014-06-07 DIAGNOSIS — O09893 Supervision of other high risk pregnancies, third trimester: Secondary | ICD-10-CM

## 2014-06-07 DIAGNOSIS — Z79899 Other long term (current) drug therapy: Secondary | ICD-10-CM

## 2014-06-07 DIAGNOSIS — F1320 Sedative, hypnotic or anxiolytic dependence, uncomplicated: Secondary | ICD-10-CM

## 2014-06-07 DIAGNOSIS — O09293 Supervision of pregnancy with other poor reproductive or obstetric history, third trimester: Secondary | ICD-10-CM

## 2014-06-07 DIAGNOSIS — O0932 Supervision of pregnancy with insufficient antenatal care, second trimester: Secondary | ICD-10-CM

## 2014-06-07 LAB — POCT URINALYSIS DIPSTICK
Blood, UA: NEGATIVE
Glucose, UA: NEGATIVE
Ketones, UA: NEGATIVE
NITRITE UA: NEGATIVE
Protein, UA: NEGATIVE

## 2014-06-07 NOTE — Progress Notes (Signed)
Single, active female fetus today at 30+[redacted] weeks GA in a cephalic presentation.  FHR 137 bpm.  BPP 8/8.  AFI is normal today at 13.82cm and SVP of 5.04 cm.  UA Dopplers within normal limits for this gestational age with a  S/D Ratio of 3.15 and RI 0.68.

## 2014-06-07 NOTE — Patient Instructions (Signed)
[place] Pediatricians:  [facility] [phone]            [facility] [phone]                 [facility] [phone] (usually doesn't accept new patients unless you have family there already, you are always welcome to call and ask)             [facility] ([address] [place]) [phone]   [place] [facility]ians:   [facility]: [phone]  [facility]: [phone]  Call the office ([phone]) or go to [facility] if:  You begin to have strong, frequent contractions  Your water breaks.  Sometimes it is a big gush of fluid, sometimes it is just a trickle that keeps getting your panties wet or running down your legs  You have vaginal bleeding.  It is normal to have a small amount of spotting if your cervix was checked.   You don't feel your baby moving like normal.  If you don't, get you something to eat and drink and lay down and focus on feeling your baby move.  You should feel at least 10 movements in 2 hours.  If you don't, you should call the office or go to [facility].     Third Trimester of Pregnancy The third trimester is from week 29 through week 42, months 7 through 9. The third trimester is a time when the fetus is growing rapidly. At the end of the ninth month, the fetus is about 20 inches in length and weighs 6-10 pounds.  BODY CHANGES Your body goes through many changes during pregnancy. The changes vary from woman to woman.  6. Your weight will continue to increase. You can expect to gain 25-35 pounds (11-16 kg) by the end of the pregnancy. 7. You may begin to get stretch marks on your hips, abdomen, and breasts. 8. You may urinate more often because the fetus is moving lower into your pelvis and pressing on your bladder. 9. You may develop or continue to have heartburn as a result of your pregnancy. 10. You may develop constipation because  certain hormones are causing the muscles that push waste through your intestines to slow down. 11. You may develop hemorrhoids or swollen, bulging veins (varicose veins). 12. You may have pelvic pain because of the weight gain and pregnancy hormones relaxing your joints between the bones in your pelvis. Backaches may result from overexertion of the muscles supporting your posture. 13. You may have changes in your hair. These can include thickening of your hair, rapid growth, and changes in texture. Some women also have hair loss during or after pregnancy, or hair that feels dry or thin. Your hair will most likely return to normal after your baby is born. 14. Your breasts will continue to grow and be tender. A yellow discharge may leak from your breasts called colostrum. 15. Your belly button may stick out. 16. You may feel short of breath because of your expanding uterus. 17. You may notice the fetus "dropping," or moving lower in your abdomen. 18. You may have a bloody mucus discharge. This usually occurs a few days to a week before labor begins. 19. Your cervix becomes thin and soft (effaced) near your due date. WHAT TO EXPECT AT YOUR PRENATAL EXAMS  You will have prenatal exams every 2 weeks until week 36. Then, you will have weekly prenatal exams. During a routine prenatal   visit:  You will be weighed to make sure you and the fetus are growing normally.  Your blood pressure is taken.  Your abdomen will be measured to track your baby's growth.  The fetal heartbeat will be listened to.  Any test results from the previous visit will be discussed.  You may have a cervical check near your due date to see if you have effaced. At around 36 weeks, your caregiver will check your cervix. At the same time, your caregiver will also perform a test on the secretions of the vaginal tissue. This test is to determine if a type of bacteria, Group B streptococcus, is present. Your caregiver will explain this  further. Your caregiver may ask you:  What your birth plan is.  How you are feeling.  If you are feeling the baby move.  If you have had any abnormal symptoms, such as leaking fluid, bleeding, severe headaches, or abdominal cramping.  If you have any questions. Other tests or screenings that may be performed during your third trimester include:  Blood tests that check for low iron levels (anemia).  Fetal testing to check the health, activity level, and growth of the fetus. Testing is done if you have certain medical conditions or if there are problems during the pregnancy. FALSE LABOR You may feel small, irregular contractions that eventually go away. These are called Braxton Hicks contractions, or false labor. Contractions may last for hours, days, or even weeks before true labor sets in. If contractions come at regular intervals, intensify, or become painful, it is best to be seen by your caregiver.  SIGNS OF LABOR   Menstrual-like cramps.  Contractions that are 5 minutes apart or less.  Contractions that start on the top of the uterus and spread down to the lower abdomen and back.  A sense of increased pelvic pressure or back pain.  A watery or bloody mucus discharge that comes from the vagina. If you have any of these signs before the 37th week of pregnancy, call your caregiver right away. You need to go to the hospital to get checked immediately. HOME CARE INSTRUCTIONS   Avoid all smoking, herbs, alcohol, and unprescribed drugs. These chemicals affect the formation and growth of the baby.  Follow your caregiver's instructions regarding medicine use. There are medicines that are either safe or unsafe to take during pregnancy.  Exercise only as directed by your caregiver. Experiencing uterine cramps is a good sign to stop exercising.  Continue to eat regular, healthy meals.  Wear a good support bra for breast tenderness.  Do not use hot tubs, steam rooms, or  saunas.  Wear your seat belt at all times when driving.  Avoid raw meat, uncooked cheese, cat litter boxes, and soil used by cats. These carry germs that can cause birth defects in the baby.  Take your prenatal vitamins.  Try taking a stool softener (if your caregiver approves) if you develop constipation. Eat more high-fiber foods, such as fresh vegetables or fruit and whole grains. Drink plenty of fluids to keep your urine clear or pale yellow.  Take warm sitz baths to soothe any pain or discomfort caused by hemorrhoids. Use hemorrhoid cream if your caregiver approves.  If you develop varicose veins, wear support hose. Elevate your feet for 15 minutes, 3-4 times a day. Limit salt in your diet.  Avoid heavy lifting, wear low heal shoes, and practice good posture.  Rest a lot with your legs elevated if you have leg cramps   or low back pain.  Visit your dentist if you have not gone during your pregnancy. Use a soft toothbrush to brush your teeth and be gentle when you floss.  A sexual relationship may be continued unless your caregiver directs you otherwise.  Do not travel far distances unless it is absolutely necessary and only with the approval of your caregiver.  Take prenatal classes to understand, practice, and ask questions about the labor and delivery.  Make a trial run to the hospital.  Pack your hospital bag.  Prepare the baby's nursery.  Continue to go to all your prenatal visits as directed by your caregiver. SEEK MEDICAL CARE IF:  You are unsure if you are in labor or if your water has broken.  You have dizziness.  You have mild pelvic cramps, pelvic pressure, or nagging pain in your abdominal area.  You have persistent nausea, vomiting, or diarrhea.  You have a bad smelling vaginal discharge.  You have pain with urination. SEEK IMMEDIATE MEDICAL CARE IF:   You have a fever.  You are leaking fluid from your vagina.  You have spotting or bleeding from your  vagina.  You have severe abdominal cramping or pain.  You have rapid weight loss or gain.  You have shortness of breath with chest pain.  You notice sudden or extreme swelling of your face, hands, ankles, feet, or legs.  You have not felt your baby move in over an hour.  You have severe headaches that do not go away with medicine.  You have vision changes. Document Released: 06/17/2001 Document Revised: 06/28/2013 Document Reviewed: 08/24/2012 ExitCare Patient Information 2015 [employer]. This information is not intended to replace advice given to you by your health care provider. Make sure you discuss any questions you have with your health care provider.  

## 2014-06-07 NOTE — Progress Notes (Signed)
High Risk Pregnancy Diagnosis(es): IUGR, h/o stillbirth, chronic benzo use G3P0110 4152w6d Estimated Date of Delivery: 08/10/14 BP 126/74 mmHg  Wt 147 lb (66.679 kg)  LMP 11/03/2013  Urinalysis: Negative HPI:  Has appt w/ MD next week to discuss decreasing Adderall & Xanax. Also has appt w/ [facility] on Friday.  BP, weight, and urine reviewed.  Reports good fm. Denies regular uc's, lof, vb, uti s/s. No complaints.  Fundal Height:  23 Fetal Heart rate:  +u/s Edema:  none  Reviewed today's u/s: afi better, dopp normal, bpp 8/8. Discussed ptl s/s, fkc All questions were answered Assessment: 6352w6d IUGR, h/o stillbirth, chronic benzo use Medication(s) Plans:  No changes Treatment Plan:  Weekly dopp/bpp, 2x/wk testing at 32wks Follow up in 1wk for high-risk OB appt and bpp/dopp u/s

## 2014-06-14 ENCOUNTER — Ambulatory Visit (INDEPENDENT_AMBULATORY_CARE_PROVIDER_SITE_OTHER): Payer: Medicaid Other | Admitting: Obstetrics & Gynecology

## 2014-06-14 ENCOUNTER — Ambulatory Visit (INDEPENDENT_AMBULATORY_CARE_PROVIDER_SITE_OTHER): Payer: Medicaid Other

## 2014-06-14 ENCOUNTER — Encounter: Payer: Self-pay | Admitting: Obstetrics & Gynecology

## 2014-06-14 ENCOUNTER — Other Ambulatory Visit: Payer: Self-pay | Admitting: Women's Health

## 2014-06-14 VITALS — BP 120/70 | Wt 149.4 lb

## 2014-06-14 DIAGNOSIS — Z331 Pregnant state, incidental: Secondary | ICD-10-CM

## 2014-06-14 DIAGNOSIS — F1910 Other psychoactive substance abuse, uncomplicated: Secondary | ICD-10-CM

## 2014-06-14 DIAGNOSIS — IMO0002 Reserved for concepts with insufficient information to code with codable children: Secondary | ICD-10-CM

## 2014-06-14 DIAGNOSIS — O365931 Maternal care for other known or suspected poor fetal growth, third trimester, fetus 1: Secondary | ICD-10-CM

## 2014-06-14 DIAGNOSIS — O99320 Drug use complicating pregnancy, unspecified trimester: Secondary | ICD-10-CM

## 2014-06-14 DIAGNOSIS — O09893 Supervision of other high risk pregnancies, third trimester: Secondary | ICD-10-CM

## 2014-06-14 DIAGNOSIS — Z1389 Encounter for screening for other disorder: Secondary | ICD-10-CM

## 2014-06-14 DIAGNOSIS — F1320 Sedative, hypnotic or anxiolytic dependence, uncomplicated: Secondary | ICD-10-CM

## 2014-06-14 DIAGNOSIS — O09293 Supervision of pregnancy with other poor reproductive or obstetric history, third trimester: Secondary | ICD-10-CM

## 2014-06-14 LAB — POCT URINALYSIS DIPSTICK
Blood, UA: NEGATIVE
Glucose, UA: NEGATIVE
Ketones, UA: NEGATIVE
LEUKOCYTES UA: NEGATIVE
NITRITE UA: NEGATIVE

## 2014-06-14 LAB — US OB FOLLOW UP

## 2014-06-14 NOTE — Progress Notes (Signed)
Sonogram is reviewed, read and report is done  High Risk Pregnancy Diagnosis(es):   Multiple medication s usage  G3P0110 607w6d Estimated Date of Delivery: 08/10/14  Blood pressure 120/70, weight 149 lb 6.4 oz (67.767 kg), last menstrual period 11/03/2013.  Urinalysis: Negative   HPI: No complaints, weaning medications     BP weight and urine results all reviewed and noted. Patient reports good fetal movement, denies any bleeding and no rupture of membranes symptoms or regular contractions.  Fundal Height:  32 Fetal Heart rate:  145 Edema:  none  Patient is without complaints. All questions were answered.  Assessment:  307w6d,   Multiple medication abuse  Medication(s) Plans:  Weaning actively  Treatment Plan:  Twice weekly now evaluations  Follow up in Tuesday N weeks for OB appt, NST

## 2014-06-14 NOTE — Progress Notes (Signed)
U/S(31+6wks)-vtx active fetus BPP 8/8, Efw 3 lb 1 oz (15Th%tile), fluid low NL AFI-9.9cm SD-5.4cm, FHR_147 bpm, UA Doppler RI-0.71 x2, anterior Gr 3 placenta

## 2014-06-14 NOTE — Addendum Note (Signed)
Addended by: Criss AlvinePULLIAM, CHRYSTAL G on: 06/14/2014 04:29 PM   Modules accepted: Orders

## 2014-06-20 ENCOUNTER — Ambulatory Visit (INDEPENDENT_AMBULATORY_CARE_PROVIDER_SITE_OTHER): Payer: Medicaid Other | Admitting: Obstetrics & Gynecology

## 2014-06-20 VITALS — BP 108/78 | Wt 150.0 lb

## 2014-06-20 DIAGNOSIS — O09293 Supervision of pregnancy with other poor reproductive or obstetric history, third trimester: Secondary | ICD-10-CM

## 2014-06-20 DIAGNOSIS — Z1389 Encounter for screening for other disorder: Secondary | ICD-10-CM

## 2014-06-20 DIAGNOSIS — Z331 Pregnant state, incidental: Secondary | ICD-10-CM

## 2014-06-20 DIAGNOSIS — O0993 Supervision of high risk pregnancy, unspecified, third trimester: Secondary | ICD-10-CM

## 2014-06-20 DIAGNOSIS — IMO0002 Reserved for concepts with insufficient information to code with codable children: Secondary | ICD-10-CM

## 2014-06-20 LAB — POCT URINALYSIS DIPSTICK
GLUCOSE UA: NEGATIVE
KETONES UA: NEGATIVE
LEUKOCYTES UA: NEGATIVE
NITRITE UA: NEGATIVE
Protein, UA: NEGATIVE
RBC UA: NEGATIVE

## 2014-06-20 NOTE — Progress Notes (Signed)
Reactive NST   High Risk Pregnancy Diagnosis(es):   Multiple meds  G3P0110 8113w5d Estimated Date of Delivery: 08/10/14  Blood pressure 108/78, weight 150 lb (68.04 kg), last menstrual period 11/03/2013.  Urinalysis: Negative   HPI: No complaints doing well      BP weight and urine results all reviewed and noted. Patient reports good fetal movement, denies any bleeding and no rupture of membranes symptoms or regular contractions.  Fundal Height:  32 Fetal Heart rate:  145 Edema:  none  Patient is without complaints. All questions were answered.  Assessment:  6313w5d,   Multiple meds, borderline oligo in past  Medication(s) Plans:  No changes  Treatment Plan:  NST Friday sonogram next week  Follow up in Friday weeks for OB appt, NST

## 2014-06-21 ENCOUNTER — Other Ambulatory Visit: Payer: Self-pay | Admitting: Obstetrics & Gynecology

## 2014-06-21 DIAGNOSIS — O365931 Maternal care for other known or suspected poor fetal growth, third trimester, fetus 1: Secondary | ICD-10-CM

## 2014-06-21 DIAGNOSIS — O09293 Supervision of pregnancy with other poor reproductive or obstetric history, third trimester: Secondary | ICD-10-CM

## 2014-06-23 ENCOUNTER — Ambulatory Visit (INDEPENDENT_AMBULATORY_CARE_PROVIDER_SITE_OTHER): Payer: Medicaid Other

## 2014-06-23 ENCOUNTER — Ambulatory Visit (INDEPENDENT_AMBULATORY_CARE_PROVIDER_SITE_OTHER): Payer: Medicaid Other | Admitting: Obstetrics & Gynecology

## 2014-06-23 ENCOUNTER — Encounter: Payer: Self-pay | Admitting: Obstetrics & Gynecology

## 2014-06-23 ENCOUNTER — Other Ambulatory Visit: Payer: Medicaid Other | Admitting: Obstetrics & Gynecology

## 2014-06-23 ENCOUNTER — Other Ambulatory Visit: Payer: Self-pay | Admitting: Obstetrics & Gynecology

## 2014-06-23 VITALS — BP 100/80 | Wt 147.0 lb

## 2014-06-23 DIAGNOSIS — O09293 Supervision of pregnancy with other poor reproductive or obstetric history, third trimester: Secondary | ICD-10-CM

## 2014-06-23 DIAGNOSIS — O365931 Maternal care for other known or suspected poor fetal growth, third trimester, fetus 1: Secondary | ICD-10-CM

## 2014-06-23 DIAGNOSIS — O99320 Drug use complicating pregnancy, unspecified trimester: Secondary | ICD-10-CM

## 2014-06-23 DIAGNOSIS — IMO0002 Reserved for concepts with insufficient information to code with codable children: Secondary | ICD-10-CM

## 2014-06-23 DIAGNOSIS — O09893 Supervision of other high risk pregnancies, third trimester: Secondary | ICD-10-CM

## 2014-06-23 DIAGNOSIS — Z1389 Encounter for screening for other disorder: Secondary | ICD-10-CM

## 2014-06-23 DIAGNOSIS — F1910 Other psychoactive substance abuse, uncomplicated: Secondary | ICD-10-CM

## 2014-06-23 DIAGNOSIS — Z331 Pregnant state, incidental: Secondary | ICD-10-CM

## 2014-06-23 LAB — POCT URINALYSIS DIPSTICK
Glucose, UA: NEGATIVE
Ketones, UA: NEGATIVE
Nitrite, UA: NEGATIVE
Protein, UA: NEGATIVE
RBC UA: NEGATIVE

## 2014-06-23 NOTE — Progress Notes (Signed)
sonogream report is done: reassuring BPP with excellent fluid and Doppler flow   High Risk Pregnancy Diagnosis(es):   History IUFD, IUGR  G3P0110 7219w1d Estimated Date of Delivery: 08/10/14  Blood pressure 100/80, weight 147 lb (66.679 kg), last menstrual period 11/03/2013.  Urinalysis: Negative   HPI: No complaints     BP weight and urine results all reviewed and noted. Patient reports good fetal movement, denies any bleeding and no rupture of membranes symptoms or regular contractions.  Fundal Height:  33 Fetal Heart rate:  162 Edema:  none  Patient is without complaints. All questions were answered.  Assessment:  7019w1d,   H/O IUFD, IUGR  Medication(s) Plans:  No changes  Treatment Plan:  Twice weekly assessments  Follow up in Monday weeks for OB appt, NST

## 2014-06-23 NOTE — Progress Notes (Signed)
U/S(33+1wk)-Footling Breech active fetus, BPP 8/8, fluid WNL AFI-11.1cm SDP_4.1cm, FHR-162 bpm, anterior Gr 2 placenta, UA Doppler RI-0.71 & 0.60

## 2014-06-26 ENCOUNTER — Other Ambulatory Visit: Payer: Medicaid Other | Admitting: Obstetrics and Gynecology

## 2014-06-29 ENCOUNTER — Other Ambulatory Visit: Payer: Medicaid Other | Admitting: Obstetrics & Gynecology

## 2014-07-03 ENCOUNTER — Other Ambulatory Visit: Payer: Self-pay | Admitting: Obstetrics & Gynecology

## 2014-07-03 DIAGNOSIS — F1910 Other psychoactive substance abuse, uncomplicated: Secondary | ICD-10-CM

## 2014-07-03 DIAGNOSIS — O99320 Drug use complicating pregnancy, unspecified trimester: Secondary | ICD-10-CM

## 2014-07-03 DIAGNOSIS — O09293 Supervision of pregnancy with other poor reproductive or obstetric history, third trimester: Secondary | ICD-10-CM

## 2014-07-03 DIAGNOSIS — O365932 Maternal care for other known or suspected poor fetal growth, third trimester, fetus 2: Secondary | ICD-10-CM

## 2014-07-04 ENCOUNTER — Encounter: Payer: Self-pay | Admitting: Advanced Practice Midwife

## 2014-07-04 ENCOUNTER — Other Ambulatory Visit: Payer: Self-pay | Admitting: Advanced Practice Midwife

## 2014-07-04 ENCOUNTER — Ambulatory Visit (INDEPENDENT_AMBULATORY_CARE_PROVIDER_SITE_OTHER): Payer: Medicaid Other | Admitting: Advanced Practice Midwife

## 2014-07-04 ENCOUNTER — Ambulatory Visit (INDEPENDENT_AMBULATORY_CARE_PROVIDER_SITE_OTHER): Payer: Medicaid Other

## 2014-07-04 VITALS — BP 100/60 | Wt 151.0 lb

## 2014-07-04 DIAGNOSIS — O09293 Supervision of pregnancy with other poor reproductive or obstetric history, third trimester: Secondary | ICD-10-CM

## 2014-07-04 DIAGNOSIS — O09893 Supervision of other high risk pregnancies, third trimester: Secondary | ICD-10-CM

## 2014-07-04 DIAGNOSIS — F1910 Other psychoactive substance abuse, uncomplicated: Secondary | ICD-10-CM

## 2014-07-04 DIAGNOSIS — IMO0002 Reserved for concepts with insufficient information to code with codable children: Secondary | ICD-10-CM

## 2014-07-04 DIAGNOSIS — O365932 Maternal care for other known or suspected poor fetal growth, third trimester, fetus 2: Secondary | ICD-10-CM

## 2014-07-04 DIAGNOSIS — Z1389 Encounter for screening for other disorder: Secondary | ICD-10-CM

## 2014-07-04 DIAGNOSIS — O289 Unspecified abnormal findings on antenatal screening of mother: Secondary | ICD-10-CM

## 2014-07-04 DIAGNOSIS — F1320 Sedative, hypnotic or anxiolytic dependence, uncomplicated: Secondary | ICD-10-CM

## 2014-07-04 DIAGNOSIS — O99320 Drug use complicating pregnancy, unspecified trimester: Secondary | ICD-10-CM

## 2014-07-04 DIAGNOSIS — Z331 Pregnant state, incidental: Secondary | ICD-10-CM

## 2014-07-04 LAB — POCT URINALYSIS DIPSTICK
GLUCOSE UA: NEGATIVE
Ketones, UA: NEGATIVE
Leukocytes, UA: NEGATIVE
NITRITE UA: NEGATIVE
RBC UA: NEGATIVE

## 2014-07-04 NOTE — Progress Notes (Signed)
High Risk Pregnancy Diagnosis(es):   IUGR, benzo and narcotic use  G3P0110 3833w5d Estimated Date of Delivery: 08/10/14  Blood pressure 100/60, weight 151 lb (68.493 kg), last menstrual period 11/03/2013.  Urinalysis: Negative   HPI: Feeling well, plenty of fetal moverment.  Still weaning, slower     BP weight and urine results all reviewed and noted. Patient reports good fetal movement, denies any bleeding and no rupture of membranes symptoms or regular contractions. US today: U/S(34+5wks)-vtx active fetus, BPP 8/8, fluid Low NL AFI-5.1cm SDp-2.1cm, anterior Gr 2 placenta, FHR-141 bpm, UA Doppler RI-0.57 & 0.53, EFW 3 lb 1 oz (<3rd%tile) (NO growth noted since previous EFw on 06/14/2014 noted) Discussed US with Dr. Despina HiddenEure.    Patient is without complaints. All questions were answered.  Assessment:  6633w5d,   IUGR, borderline low AFI  Medication(s) Plans:  Still trying to wean down xanax  Treatment Plan:  NSt Friday at [facility]U; US (bpp/dopplers/AFI) 1 week.  Plan IOL 37 weeks or a indicated

## 2014-07-04 NOTE — Patient Instructions (Signed)
Go to Maternity Admissions Unit and tell them you are there for a NST r/t IUGR/borderline low AFI

## 2014-07-04 NOTE — Progress Notes (Signed)
U/S(34+5wks)-vtx active fetus, BPP 8/8, fluid Low NL AFI-5.1cm SDp-2.1cm, anterior Gr 2 placenta, FHR-141 bpm, UA Doppler RI-0.57 & 0.53, EFW 3 lb 1 oz (<3rd%tile) (NO growth noted since previous EFw on 06/14/2014 noted)

## 2014-07-05 LAB — DRUG SCREEN, URINE, NO CONFIRMATION
Amphetamine Screen, Ur: NEGATIVE
Barbiturate Quant, Ur: NEGATIVE
Benzodiazepines.: NEGATIVE
Cocaine Metabolites: NEGATIVE
Creatinine,U: 142.4 mg/dL
MARIJUANA METABOLITE: NEGATIVE
Methadone: NEGATIVE
OPIATE SCREEN, URINE: NEGATIVE
PHENCYCLIDINE (PCP): NEGATIVE
PROPOXYPHENE: NEGATIVE

## 2014-07-05 LAB — OXYCODONE SCREEN, UA, RFLX CONFIRM

## 2014-07-06 ENCOUNTER — Encounter: Payer: Self-pay | Admitting: Advanced Practice Midwife

## 2014-07-06 DIAGNOSIS — F1110 Opioid abuse, uncomplicated: Secondary | ICD-10-CM | POA: Insufficient documentation

## 2014-07-06 LAB — OPIATES/OPIOIDS (LC/MS-MS)
Codeine Urine: NEGATIVE ng/mL (ref <50)
HYDROMORPHONE: NEGATIVE ng/mL (ref <50)
Hydrocodone: NEGATIVE ng/mL (ref <50)
Morphine Urine: NEGATIVE ng/mL (ref <50)
NORHYDROCODONE, UR: NEGATIVE ng/mL (ref <50)
NOROXYCODONE, UR: 1806 ng/mL — AB (ref <50)
Oxycodone, ur: 449 ng/mL — ABNORMAL HIGH (ref <50)
Oxymorphone: 973 ng/mL — ABNORMAL HIGH (ref <50)

## 2014-07-07 ENCOUNTER — Encounter (HOSPITAL_COMMUNITY): Payer: Self-pay | Admitting: *Deleted

## 2014-07-07 ENCOUNTER — Inpatient Hospital Stay (HOSPITAL_COMMUNITY)
Admission: AD | Admit: 2014-07-07 | Discharge: 2014-07-10 | DRG: 765 | Disposition: A | Discharge status: Home / Self Care | Payer: Medicaid Other | Source: Ambulatory Visit | Attending: Obstetrics and Gynecology | Admitting: Obstetrics and Gynecology

## 2014-07-07 DIAGNOSIS — IMO0002 Reserved for concepts with insufficient information to code with codable children: Secondary | ICD-10-CM

## 2014-07-07 DIAGNOSIS — O99334 Smoking (tobacco) complicating childbirth: Secondary | ICD-10-CM | POA: Diagnosis present

## 2014-07-07 DIAGNOSIS — F17210 Nicotine dependence, cigarettes, uncomplicated: Secondary | ICD-10-CM | POA: Diagnosis present

## 2014-07-07 DIAGNOSIS — O368130 Decreased fetal movements, third trimester, not applicable or unspecified: Secondary | ICD-10-CM | POA: Diagnosis present

## 2014-07-07 DIAGNOSIS — Z8759 Personal history of other complications of pregnancy, childbirth and the puerperium: Secondary | ICD-10-CM | POA: Insufficient documentation

## 2014-07-07 DIAGNOSIS — Z3A35 35 weeks gestation of pregnancy: Secondary | ICD-10-CM | POA: Diagnosis present

## 2014-07-07 DIAGNOSIS — O365990 Maternal care for other known or suspected poor fetal growth, unspecified trimester, not applicable or unspecified: Secondary | ICD-10-CM

## 2014-07-07 DIAGNOSIS — Z98891 History of uterine scar from previous surgery: Secondary | ICD-10-CM

## 2014-07-07 DIAGNOSIS — O4593 Premature separation of placenta, unspecified, third trimester: Secondary | ICD-10-CM | POA: Diagnosis present

## 2014-07-07 DIAGNOSIS — O365930 Maternal care for other known or suspected poor fetal growth, third trimester, not applicable or unspecified: Secondary | ICD-10-CM | POA: Diagnosis present

## 2014-07-07 MED ORDER — LACTATED RINGERS IV BOLUS (SEPSIS)
500.0000 mL | Freq: Once | INTRAVENOUS | Status: AC
  Administered 2014-07-08: 500 mL via INTRAVENOUS

## 2014-07-07 NOTE — MAU Note (Signed)
Pt states not feeling baby move in last two hours.

## 2014-07-07 NOTE — MAU Note (Signed)
Last felt baby move 2 hours ago. Denies LOF or vaginal bleeding. Denies contractions.

## 2014-07-08 ENCOUNTER — Inpatient Hospital Stay (HOSPITAL_COMMUNITY): Payer: Medicaid Other | Admitting: Anesthesiology

## 2014-07-08 ENCOUNTER — Encounter (HOSPITAL_COMMUNITY): Payer: Self-pay | Admitting: Anesthesiology

## 2014-07-08 ENCOUNTER — Encounter (HOSPITAL_COMMUNITY)
Admission: AD | Disposition: A | Discharge status: Home / Self Care | Payer: Self-pay | Source: Ambulatory Visit | Attending: Obstetrics and Gynecology

## 2014-07-08 ENCOUNTER — Inpatient Hospital Stay (HOSPITAL_COMMUNITY): Payer: Medicaid Other

## 2014-07-08 ENCOUNTER — Other Ambulatory Visit: Payer: Self-pay | Admitting: Obstetrics and Gynecology

## 2014-07-08 DIAGNOSIS — Z3A35 35 weeks gestation of pregnancy: Secondary | ICD-10-CM | POA: Insufficient documentation

## 2014-07-08 DIAGNOSIS — Z8759 Personal history of other complications of pregnancy, childbirth and the puerperium: Secondary | ICD-10-CM | POA: Insufficient documentation

## 2014-07-08 DIAGNOSIS — IMO0002 Reserved for concepts with insufficient information to code with codable children: Secondary | ICD-10-CM | POA: Insufficient documentation

## 2014-07-08 DIAGNOSIS — Z3483 Encounter for supervision of other normal pregnancy, third trimester: Secondary | ICD-10-CM | POA: Diagnosis present

## 2014-07-08 DIAGNOSIS — Z98891 History of uterine scar from previous surgery: Secondary | ICD-10-CM

## 2014-07-08 DIAGNOSIS — O368130 Decreased fetal movements, third trimester, not applicable or unspecified: Secondary | ICD-10-CM | POA: Diagnosis present

## 2014-07-08 DIAGNOSIS — F17210 Nicotine dependence, cigarettes, uncomplicated: Secondary | ICD-10-CM | POA: Diagnosis present

## 2014-07-08 DIAGNOSIS — O4593 Premature separation of placenta, unspecified, third trimester: Secondary | ICD-10-CM | POA: Diagnosis present

## 2014-07-08 DIAGNOSIS — O365990 Maternal care for other known or suspected poor fetal growth, unspecified trimester, not applicable or unspecified: Secondary | ICD-10-CM

## 2014-07-08 DIAGNOSIS — O99334 Smoking (tobacco) complicating childbirth: Secondary | ICD-10-CM | POA: Diagnosis present

## 2014-07-08 DIAGNOSIS — O365930 Maternal care for other known or suspected poor fetal growth, third trimester, not applicable or unspecified: Secondary | ICD-10-CM | POA: Diagnosis present

## 2014-07-08 LAB — COMPREHENSIVE METABOLIC PANEL
ALBUMIN: 2.7 g/dL — AB (ref 3.5–5.2)
ALK PHOS: 161 U/L — AB (ref 39–117)
ALT: 10 U/L (ref 0–35)
ANION GAP: 5 (ref 5–15)
AST: 18 U/L (ref 0–37)
BILIRUBIN TOTAL: 0.3 mg/dL (ref 0.3–1.2)
BUN: 11 mg/dL (ref 6–23)
CALCIUM: 8.7 mg/dL (ref 8.4–10.5)
CHLORIDE: 110 meq/L (ref 96–112)
CO2: 23 mmol/L (ref 19–32)
CREATININE: 0.47 mg/dL — AB (ref 0.50–1.10)
GFR calc non Af Amer: >90 mL/min (ref >90)
Glucose, Bld: 75 mg/dL (ref 70–99)
Potassium: 4.2 mmol/L (ref 3.5–5.1)
Sodium: 138 mmol/L (ref 135–145)
Total Protein: 5.8 g/dL — ABNORMAL LOW (ref 6.0–8.3)

## 2014-07-08 LAB — TYPE AND SCREEN
ABO/RH(D): O POS
Antibody Screen: NEGATIVE

## 2014-07-08 LAB — CBC
HCT: 34.8 % — ABNORMAL LOW (ref 36.0–46.0)
HEMATOCRIT: 34.6 % — AB (ref 36.0–46.0)
HEMOGLOBIN: 11.9 g/dL — AB (ref 12.0–15.0)
Hemoglobin: 12.1 g/dL (ref 12.0–15.0)
MCH: 30.1 pg (ref 26.0–34.0)
MCH: 30.4 pg (ref 26.0–34.0)
MCHC: 34.4 g/dL (ref 30.0–36.0)
MCHC: 34.8 g/dL (ref 30.0–36.0)
MCV: 87.4 fL (ref 78.0–100.0)
MCV: 87.6 fL (ref 78.0–100.0)
PLATELETS: 164 10*3/uL (ref 150–400)
PLATELETS: 168 10*3/uL (ref 150–400)
RBC: 3.95 MIL/uL (ref 3.87–5.11)
RBC: 3.98 MIL/uL (ref 3.87–5.11)
RDW: 13.5 % (ref 11.5–15.5)
RDW: 13.5 % (ref 11.5–15.5)
WBC: 12.6 10*3/uL — ABNORMAL HIGH (ref 4.0–10.5)
WBC: 17.7 10*3/uL — ABNORMAL HIGH (ref 4.0–10.5)

## 2014-07-08 LAB — MRSA PCR SCREENING: MRSA by PCR: NEGATIVE

## 2014-07-08 LAB — ABO/RH: ABO/RH(D): O POS

## 2014-07-08 SURGERY — Surgical Case
Anesthesia: Spinal | Site: Abdomen

## 2014-07-08 MED ORDER — IBUPROFEN 600 MG PO TABS: 600.0000 mg | ORAL_TABLET | Freq: Four times a day (QID) | ORAL | Status: DC | PRN

## 2014-07-08 MED ORDER — DEXAMETHASONE SODIUM PHOSPHATE 10 MG/ML IJ SOLN
INTRAMUSCULAR | Status: AC
  Filled 2014-07-08: qty 1

## 2014-07-08 MED ORDER — KETOROLAC TROMETHAMINE 30 MG/ML IJ SOLN
30.0000 mg | Freq: Four times a day (QID) | INTRAMUSCULAR | Status: AC | PRN
  Administered 2014-07-08 (×2): 30 mg via INTRAVENOUS

## 2014-07-08 MED ORDER — NALBUPHINE HCL 10 MG/ML IJ SOLN: 5.0000 mg | INTRAMUSCULAR | Status: DC | PRN

## 2014-07-08 MED ORDER — KETOROLAC TROMETHAMINE 30 MG/ML IJ SOLN
INTRAMUSCULAR | Status: AC
  Filled 2014-07-08: qty 1

## 2014-07-08 MED ORDER — SCOPOLAMINE 1 MG/3DAYS TD PT72
MEDICATED_PATCH | TRANSDERMAL | Status: AC
  Filled 2014-07-08: qty 1

## 2014-07-08 MED ORDER — CEFAZOLIN SODIUM-DEXTROSE 2-3 GM-% IV SOLR
2.0000 g | Freq: Once | INTRAVENOUS | Status: AC
  Administered 2014-07-08: 2 g via INTRAVENOUS
  Filled 2014-07-08: qty 50

## 2014-07-08 MED ORDER — NALBUPHINE HCL 10 MG/ML IJ SOLN: 5.0000 mg | Freq: Once | INTRAMUSCULAR | Status: AC | PRN

## 2014-07-08 MED ORDER — MEPERIDINE HCL 25 MG/ML IJ SOLN
6.2500 mg | INTRAMUSCULAR | Status: DC | PRN
  Administered 2014-07-08: 6.25 mg via INTRAVENOUS

## 2014-07-08 MED ORDER — MEPERIDINE HCL 25 MG/ML IJ SOLN
INTRAMUSCULAR | Status: AC
  Filled 2014-07-08: qty 1

## 2014-07-08 MED ORDER — ONDANSETRON HCL 4 MG/2ML IJ SOLN
INTRAMUSCULAR | Status: AC
  Filled 2014-07-08: qty 2

## 2014-07-08 MED ORDER — PHENYLEPHRINE 8 MG IN D5W 100 ML (0.08MG/ML) PREMIX OPTIME
INJECTION | INTRAVENOUS | Status: DC | PRN
  Administered 2014-07-08: 45 ug/min via INTRAVENOUS

## 2014-07-08 MED ORDER — ONDANSETRON HCL 4 MG/2ML IJ SOLN
INTRAMUSCULAR | Status: DC | PRN
  Administered 2014-07-08: 4 mg via INTRAVENOUS

## 2014-07-08 MED ORDER — HYDROMORPHONE HCL 1 MG/ML IJ SOLN
INTRAMUSCULAR | Status: AC
  Filled 2014-07-08: qty 1

## 2014-07-08 MED ORDER — NICOTINE 21 MG/24HR TD PT24
21.0000 mg | MEDICATED_PATCH | TRANSDERMAL | Status: DC
  Filled 2014-07-08 (×3): qty 1

## 2014-07-08 MED ORDER — BUPIVACAINE IN DEXTROSE 0.75-8.25 % IT SOLN
INTRATHECAL | Status: DC | PRN
  Administered 2014-07-08: 12 mg via INTRATHECAL

## 2014-07-08 MED ORDER — DIBUCAINE 1 % RE OINT
1.0000 "application " | TOPICAL_OINTMENT | RECTAL | Status: DC | PRN
  Filled 2014-07-08: qty 28

## 2014-07-08 MED ORDER — SIMETHICONE 80 MG PO CHEW
80.0000 mg | CHEWABLE_TABLET | ORAL | Status: DC | PRN
  Filled 2014-07-08: qty 1

## 2014-07-08 MED ORDER — HYDROMORPHONE HCL 1 MG/ML IJ SOLN
0.5000 mg | INTRAMUSCULAR | Status: DC | PRN
  Administered 2014-07-08: 0.5 mg via INTRAVENOUS

## 2014-07-08 MED ORDER — MORPHINE SULFATE 0.5 MG/ML IJ SOLN
INTRAMUSCULAR | Status: AC
  Filled 2014-07-08: qty 10

## 2014-07-08 MED ORDER — SCOPOLAMINE 1 MG/3DAYS TD PT72: 1.0000 | MEDICATED_PATCH | Freq: Once | TRANSDERMAL | Status: DC

## 2014-07-08 MED ORDER — CEFAZOLIN SODIUM-DEXTROSE 2-3 GM-% IV SOLR: 2.0000 g | INTRAVENOUS | Status: DC

## 2014-07-08 MED ORDER — IBUPROFEN 600 MG PO TABS
600.0000 mg | ORAL_TABLET | Freq: Four times a day (QID) | ORAL | Status: DC
  Administered 2014-07-08 – 2014-07-10 (×8): 600 mg via ORAL
  Filled 2014-07-08 (×8): qty 1

## 2014-07-08 MED ORDER — ONDANSETRON HCL 4 MG/2ML IJ SOLN: 4.0000 mg | INTRAMUSCULAR | Status: DC | PRN

## 2014-07-08 MED ORDER — OXYCODONE-ACETAMINOPHEN 5-325 MG PO TABS: 1.0000 | ORAL_TABLET | ORAL | Status: DC | PRN

## 2014-07-08 MED ORDER — MENTHOL 3 MG MT LOZG
1.0000 | LOZENGE | OROMUCOSAL | Status: DC | PRN
  Filled 2014-07-08: qty 9

## 2014-07-08 MED ORDER — OXYCODONE-ACETAMINOPHEN 5-325 MG PO TABS
2.0000 | ORAL_TABLET | ORAL | Status: DC | PRN
  Administered 2014-07-08 – 2014-07-10 (×11): 2 via ORAL
  Filled 2014-07-08 (×11): qty 2

## 2014-07-08 MED ORDER — LACTATED RINGERS IV SOLN
INTRAVENOUS | Status: DC | PRN
  Administered 2014-07-08 (×2): via INTRAVENOUS

## 2014-07-08 MED ORDER — SIMETHICONE 80 MG PO CHEW
80.0000 mg | CHEWABLE_TABLET | Freq: Three times a day (TID) | ORAL | Status: DC
  Administered 2014-07-08 – 2014-07-10 (×7): 80 mg via ORAL
  Filled 2014-07-08 (×13): qty 1

## 2014-07-08 MED ORDER — OXYTOCIN 10 UNIT/ML IJ SOLN
INTRAMUSCULAR | Status: AC
  Filled 2014-07-08: qty 4

## 2014-07-08 MED ORDER — WITCH HAZEL-GLYCERIN EX PADS: 1.0000 "application " | MEDICATED_PAD | CUTANEOUS | Status: DC | PRN

## 2014-07-08 MED ORDER — PROMETHAZINE HCL 25 MG/ML IJ SOLN: 6.2500 mg | INTRAMUSCULAR | Status: DC | PRN

## 2014-07-08 MED ORDER — FAMOTIDINE IN NACL 20-0.9 MG/50ML-% IV SOLN
20.0000 mg | Freq: Once | INTRAVENOUS | Status: AC
  Administered 2014-07-08: 20 mg via INTRAVENOUS
  Filled 2014-07-08: qty 50

## 2014-07-08 MED ORDER — SIMETHICONE 80 MG PO CHEW
80.0000 mg | CHEWABLE_TABLET | ORAL | Status: DC
  Administered 2014-07-08 – 2014-07-10 (×2): 80 mg via ORAL
  Filled 2014-07-08 (×4): qty 1

## 2014-07-08 MED ORDER — KETOROLAC TROMETHAMINE 30 MG/ML IJ SOLN
30.0000 mg | Freq: Four times a day (QID) | INTRAMUSCULAR | Status: AC | PRN
Start: 2014-07-08 — End: 2014-07-09

## 2014-07-08 MED ORDER — LANOLIN HYDROUS EX OINT: 1.0000 "application " | TOPICAL_OINTMENT | CUTANEOUS | Status: DC | PRN

## 2014-07-08 MED ORDER — AMPHETAMINE-DEXTROAMPHET ER 30 MG PO CP24: 30.0000 mg | ORAL_CAPSULE | Freq: Every day | ORAL | Status: DC

## 2014-07-08 MED ORDER — DIPHENHYDRAMINE HCL 25 MG PO CAPS
25.0000 mg | ORAL_CAPSULE | ORAL | Status: DC | PRN
  Filled 2014-07-08: qty 1

## 2014-07-08 MED ORDER — MORPHINE SULFATE (PF) 0.5 MG/ML IJ SOLN
INTRAMUSCULAR | Status: DC | PRN
  Administered 2014-07-08: 4.8 mg via EPIDURAL
  Administered 2014-07-08: .2 mg via INTRATHECAL

## 2014-07-08 MED ORDER — DIPHENHYDRAMINE HCL 25 MG PO CAPS
25.0000 mg | ORAL_CAPSULE | Freq: Four times a day (QID) | ORAL | Status: DC | PRN
  Filled 2014-07-08: qty 1

## 2014-07-08 MED ORDER — CITRIC ACID-SODIUM CITRATE 334-500 MG/5ML PO SOLN
30.0000 mL | Freq: Once | ORAL | Status: AC
  Administered 2014-07-08: 30 mL via ORAL
  Filled 2014-07-08: qty 15

## 2014-07-08 MED ORDER — ONDANSETRON HCL 4 MG/2ML IJ SOLN: 4.0000 mg | Freq: Three times a day (TID) | INTRAMUSCULAR | Status: DC | PRN

## 2014-07-08 MED ORDER — NALBUPHINE HCL 10 MG/ML IJ SOLN
5.0000 mg | INTRAMUSCULAR | Status: DC | PRN
Start: 2014-07-08 — End: 2014-07-10

## 2014-07-08 MED ORDER — PHENYLEPHRINE 8 MG IN D5W 100 ML (0.08MG/ML) PREMIX OPTIME
INJECTION | INTRAVENOUS | Status: AC
  Filled 2014-07-08: qty 100

## 2014-07-08 MED ORDER — ONDANSETRON HCL 4 MG PO TABS: 4.0000 mg | ORAL_TABLET | ORAL | Status: DC | PRN

## 2014-07-08 MED ORDER — SENNOSIDES-DOCUSATE SODIUM 8.6-50 MG PO TABS
2.0000 | ORAL_TABLET | ORAL | Status: DC
  Administered 2014-07-08 – 2014-07-10 (×2): 2 via ORAL
  Filled 2014-07-08 (×4): qty 2

## 2014-07-08 MED ORDER — DIPHENHYDRAMINE HCL 50 MG/ML IJ SOLN: 12.5000 mg | INTRAMUSCULAR | Status: DC | PRN

## 2014-07-08 MED ORDER — PRENATAL MULTIVITAMIN CH
1.0000 | ORAL_TABLET | Freq: Every day | ORAL | Status: DC
  Administered 2014-07-08 – 2014-07-09 (×2): 1 via ORAL
  Filled 2014-07-08 (×4): qty 1

## 2014-07-08 MED ORDER — DEXAMETHASONE SODIUM PHOSPHATE 4 MG/ML IJ SOLN
INTRAMUSCULAR | Status: DC | PRN
  Administered 2014-07-08: 4 mg via INTRAVENOUS

## 2014-07-08 MED ORDER — SCOPOLAMINE 1 MG/3DAYS TD PT72
MEDICATED_PATCH | TRANSDERMAL | Status: DC | PRN
  Administered 2014-07-08: 1 via TRANSDERMAL

## 2014-07-08 MED ORDER — SODIUM CHLORIDE 0.9 % IJ SOLN: 3.0000 mL | INTRAMUSCULAR | Status: DC | PRN

## 2014-07-08 MED ORDER — FENTANYL CITRATE 0.05 MG/ML IJ SOLN
INTRAMUSCULAR | Status: AC
  Filled 2014-07-08: qty 2

## 2014-07-08 MED ORDER — ALPRAZOLAM 0.5 MG PO TABS
2.0000 mg | ORAL_TABLET | Freq: Two times a day (BID) | ORAL | Status: DC
  Administered 2014-07-08 – 2014-07-10 (×5): 2 mg via ORAL
  Filled 2014-07-08 (×5): qty 4

## 2014-07-08 MED ORDER — TETANUS-DIPHTH-ACELL PERTUSSIS 5-2.5-18.5 LF-MCG/0.5 IM SUSP
0.5000 mL | Freq: Once | INTRAMUSCULAR | Status: AC
  Administered 2014-07-10: 0.5 mL via INTRAMUSCULAR
  Filled 2014-07-08 (×2): qty 0.5

## 2014-07-08 MED ORDER — ZOLPIDEM TARTRATE 5 MG PO TABS: 5.0000 mg | ORAL_TABLET | Freq: Every evening | ORAL | Status: DC | PRN

## 2014-07-08 MED ORDER — FENTANYL CITRATE 0.05 MG/ML IJ SOLN
INTRAMUSCULAR | Status: DC | PRN
  Administered 2014-07-08: 87.5 ug via INTRAVENOUS
  Administered 2014-07-08: 12.5 ug via INTRATHECAL

## 2014-07-08 MED ORDER — NALOXONE HCL 0.4 MG/ML IJ SOLN: 0.4000 mg | INTRAMUSCULAR | Status: DC | PRN

## 2014-07-08 MED ORDER — NALOXONE HCL 1 MG/ML IJ SOLN
1.0000 ug/kg/h | INTRAVENOUS | Status: DC | PRN
  Filled 2014-07-08: qty 2

## 2014-07-08 MED ORDER — OXYTOCIN 40 UNITS IN LACTATED RINGERS INFUSION - SIMPLE MED: 62.5000 mL/h | INTRAVENOUS | Status: AC

## 2014-07-08 MED ORDER — HYDROMORPHONE HCL 1 MG/ML IJ SOLN
0.2500 mg | INTRAMUSCULAR | Status: DC | PRN
  Administered 2014-07-08 (×6): 0.5 mg via INTRAVENOUS

## 2014-07-08 MED ORDER — LACTATED RINGERS IV SOLN
INTRAVENOUS | Status: DC
Start: 2014-07-08 — End: 2014-07-10

## 2014-07-08 SURGICAL SUPPLY — 35 items
APL SKNCLS STERI-STRIP NONHPOA (GAUZE/BANDAGES/DRESSINGS) ×1
BENZOIN TINCTURE PRP APPL 2/3 (GAUZE/BANDAGES/DRESSINGS) ×2 IMPLANT
CLAMP CORD UMBIL (MISCELLANEOUS) ×2 IMPLANT
CLOSURE WOUND 1/2 X4 (GAUZE/BANDAGES/DRESSINGS) ×1
CLOTH BEACON ORANGE TIMEOUT ST (SAFETY) ×3 IMPLANT
DRAPE SHEET LG 3/4 BI-LAMINATE (DRAPES) ×2 IMPLANT
DRSG OPSITE POSTOP 4X10 (GAUZE/BANDAGES/DRESSINGS) ×3 IMPLANT
DURAPREP 26ML APPLICATOR (WOUND CARE) ×3 IMPLANT
ELECT REM PT RETURN 9FT ADLT (ELECTROSURGICAL) ×3
ELECTRODE REM PT RTRN 9FT ADLT (ELECTROSURGICAL) ×1 IMPLANT
EXTRACTOR VACUUM KIWI (MISCELLANEOUS) IMPLANT
GLOVE BIO SURGEON ST LM GN SZ9 (GLOVE) ×3 IMPLANT
GLOVE BIOGEL PI IND STRL 9 (GLOVE) ×1 IMPLANT
GLOVE BIOGEL PI INDICATOR 9 (GLOVE) ×2
GOWN STRL REUS W/TWL 2XL LVL3 (GOWN DISPOSABLE) ×3 IMPLANT
GOWN STRL REUS W/TWL LRG LVL3 (GOWN DISPOSABLE) ×3 IMPLANT
NDL HYPO 25X5/8 SAFETYGLIDE (NEEDLE) IMPLANT
NEEDLE HYPO 25X5/8 SAFETYGLIDE (NEEDLE) IMPLANT
NS IRRIG 1000ML POUR BTL (IV SOLUTION) ×3 IMPLANT
PACK C SECTION WH (CUSTOM PROCEDURE TRAY) ×3 IMPLANT
PAD OB MATERNITY 4.3X12.25 (PERSONAL CARE ITEMS) ×3 IMPLANT
RTRCTR C-SECT PINK 25CM LRG (MISCELLANEOUS) IMPLANT
RTRCTR C-SECT PINK 34CM XLRG (MISCELLANEOUS) IMPLANT
STRIP CLOSURE SKIN 1/2X4 (GAUZE/BANDAGES/DRESSINGS) ×1 IMPLANT
SUT MNCRL 0 VIOLET CTX 36 (SUTURE) ×2 IMPLANT
SUT MONOCRYL 0 CTX 36 (SUTURE) ×4
SUT VIC AB 0 CT1 27 (SUTURE) ×3
SUT VIC AB 0 CT1 27XBRD ANBCTR (SUTURE) ×1 IMPLANT
SUT VIC AB 2-0 CT1 27 (SUTURE) ×3
SUT VIC AB 2-0 CT1 TAPERPNT 27 (SUTURE) ×1 IMPLANT
SUT VIC AB 4-0 KS 27 (SUTURE) ×3 IMPLANT
SYR BULB IRRIGATION 50ML (SYRINGE) IMPLANT
TOWEL OR 17X24 6PK STRL BLUE (TOWEL DISPOSABLE) ×3 IMPLANT
TRAY FOLEY CATH 14FR (SET/KITS/TRAYS/PACK) ×3 IMPLANT
WATER STERILE IRR 1000ML POUR (IV SOLUTION) ×3 IMPLANT

## 2014-07-08 NOTE — Brief Op Note (Signed)
07/07/2014 - 07/08/2014  3:02 AM  PATIENT:  Berna Bue  19 y.o. female  PRE-OPERATIVE DIAGNOSIS:  non reassuring fetal heart rate, IUGR, pregnancy 35+2 wk  POST-OPERATIVE DIAGNOSIS:  non reassuring fetal heart rate, IUGR, chronic placental abruption  PROCEDURE:  Procedure(s): CESAREAN SECTION (N/A) primary low transverse  SURGEON:  Surgeon(s) and Role:    * Tilda Burrow, MD - Primary  PHYSICIAN ASSISTANT:   ASSISTANTS: none   ANESTHESIA:   spinal  EBL:  Total I/O In: 1000 [I.V.:1000] Out: 700 [Urine:200; Blood:500]  BLOOD ADMINISTERED:none  DRAINS: Urinary Catheter (Foley)   LOCAL MEDICATIONS USED:  NONE  SPECIMEN:  Source of Specimen:  placenta to Pathology  DISPOSITION OF SPECIMEN:  PATHOLOGY  COUNTS:  YES  TOURNIQUET:  * No tourniquets in log *  DICTATION: .Dragon Dictation  PLAN OF CARE: Admit to inpatient   PATIENT DISPOSITION:  PACU - hemodynamically stable.   Delay start of Pharmacological VTE agent (>24hrs) due to surgical blood loss or risk of bleeding: not applicable

## 2014-07-08 NOTE — Anesthesia Preprocedure Evaluation (Addendum)
Anesthesia Evaluation  Patient identified by MRN, date of birth, ID band Patient awake    Reviewed: Allergy & Precautions, H&P , NPO status , Patient's Chart, lab work & pertinent test results  Airway Mallampati: II  TM Distance: >3 FB Neck ROM: full    Dental no notable dental hx.    Pulmonary neg pulmonary ROS, Current Smoker,    Pulmonary exam normal       Cardiovascular negative cardio ROS      Neuro/Psych    GI/Hepatic negative GI ROS, Neg liver ROS,   Endo/Other  negative endocrine ROS  Renal/GU negative Renal ROS     Musculoskeletal   Abdominal Normal abdominal exam  (+)   Peds  Hematology negative hematology ROS (+)   Anesthesia Other Findings   Reproductive/Obstetrics (+) Pregnancy                             Anesthesia Physical Anesthesia Plan  ASA: II and emergent  Anesthesia Plan: Spinal   Post-op Pain Management:    Induction:   Airway Management Planned:   Additional Equipment:   Intra-op Plan:   Post-operative Plan:   Informed Consent: I have reviewed the patients History and Physical, chart, labs and discussed the procedure including the risks, benefits and alternatives for the proposed anesthesia with the patient or authorized representative who has indicated his/her understanding and acceptance.     Plan Discussed with: CRNA and Surgeon  Anesthesia Plan Comments:         Anesthesia Quick Evaluation                                  Anesthesia Evaluation  Patient identified by MRN, date of birth, ID band Patient awake    Reviewed: Allergy & Precautions, H&P , NPO status , Patient's Chart, lab work & pertinent test results  Airway Mallampati: II TM Distance: >3 FB Neck ROM: full    Dental No notable dental hx.    Pulmonary neg pulmonary ROS,  breath sounds clear to auscultation  Pulmonary exam normal        Cardiovascular negative cardio ROS      Neuro/Psych    GI/Hepatic negative GI ROS, Neg liver ROS,   Endo/Other  negative endocrine ROS  Renal/GU negative Renal ROS  negative genitourinary   Musculoskeletal negative musculoskeletal ROS (+)   Abdominal Normal abdominal exam  (+)   Peds negative pediatric ROS (+)  Hematology negative hematology ROS (+)   Anesthesia Other Findings   Reproductive/Obstetrics (+) Pregnancy                           Anesthesia Physical Anesthesia Plan  ASA: II  Anesthesia Plan: Epidural   Post-op Pain Management:    Induction:   Airway Management Planned:   Additional Equipment:   Intra-op Plan:   Post-operative Plan:   Informed Consent: I have reviewed the patients History and Physical, chart, labs and discussed the procedure including the risks, benefits and alternatives for the proposed anesthesia with the patient or authorized representative who has indicated his/her understanding and acceptance.     Plan Discussed with:   Anesthesia Plan Comments:         Anesthesia Quick Evaluation

## 2014-07-08 NOTE — Anesthesia Procedure Notes (Signed)
Spinal Patient location during procedure: OR Start time: 07/08/2014 2:10 AM End time: 07/08/2014 2:13 AM Staffing Anesthesiologist: Leilani Able Performed by: anesthesiologist  Preanesthetic Checklist Completed: patient identified, surgical consent, pre-op evaluation, timeout performed, IV checked, risks and benefits discussed and monitors and equipment checked Spinal Block Patient position: sitting Prep: site prepped and draped and DuraPrep Patient monitoring: heart rate, cardiac monitor, continuous pulse ox and blood pressure Approach: midline Location: L3-4 Injection technique: single-shot Needle Needle type: Pencan  Needle gauge: 24 G Needle length: 9 cm Needle insertion depth: 5 cm Assessment Sensory level: T4

## 2014-07-08 NOTE — Transfer of Care (Signed)
Immediate Anesthesia Transfer of Care Note  Patient: Carmen Martin  Procedure(s) Performed: Procedure(s): CESAREAN SECTION (N/A)  Patient Location: PACU  Anesthesia Type:Spinal  Level of Consciousness: awake, alert , oriented and patient cooperative  Airway & Oxygen Therapy: Patient Spontanous Breathing  Post-op Assessment: Report given to PACU RN and Post -op Vital signs reviewed and stable  Post vital signs: Reviewed and stable  Complications: No apparent anesthesia complications

## 2014-07-08 NOTE — Anesthesia Postprocedure Evaluation (Signed)
Anesthesia Post Note  Patient: Carmen Martin  Procedure(s) Performed: Procedure(s) (LRB): CESAREAN SECTION (N/A)  Anesthesia type: Spinal  Patient location: PACU  Post pain: Pain level controlled  Post assessment: Post-op Vital signs reviewed  Last Vitals:  Filed Vitals:   07/08/14 0315  BP: 86/45  Pulse: 60  Temp:   Resp: 29    Post vital signs: Reviewed  Level of consciousness: awake  Complications: No apparent anesthesia complications

## 2014-07-08 NOTE — MAU Note (Signed)
History   Arrival to [facility] for eval for decreased fetal movement. Recent history of absent fetal growth over 2 consecutive growth u/s  CSN: [id number]  Arrival date and time: 07/07/14 2319   First Provider Initiated Contact with Patient 07/08/14 0055      Chief Complaint  Patient presents with  . Decreased Fetal Movement   HPI  18 yr G3 P0110 105w2d  Presents with complaints of decreased fetal movement, arrives at MAU, where  BPP is ordered and results are 4/8 2 each for AFI, breathing movements, without adequate fetal limb movements. EDF present. Pt takes Benzodiazepines, took Alprazolam  at midafternoon, which is a decreased dose from previous doses which she was on from Dr Carmen Martin at [facility],  Recent prenatal course notable for absence of fetal growth by last 2 u.s , most recently u.s on 12/29 showed unchanged fetal growth, with EFW at <3% for gest age, but BPP was 8/8 and Dopplers showed a RI of .55, indicating good doppler flow. Patient will be observed overnight, and BPP repeated in AM , and dopplers repeated. Pt advised NOT to take any more alprazolam or any sedative while here, until reeval.  Last solids: doritos at 9 pm, liquid at 10:30.  Past Medical History  Diagnosis Date  . Anxiety   . Headache(784.0)   . Depression   . ADD (attention deficit disorder)   . Hx MRSA infection     in buttocks  . ADHD (attention deficit hyperactivity disorder) 11/02/2012    Past Surgical History  Procedure Laterality Date  . No past surgeries    . Wisdom tooth extraction  2014  . Colonoscopy with propofol N/A 09/29/2012    Procedure: COLONOSCOPY WITH PROPOFOL;  Surgeon: Malissa Hippo, MD;  Location: [facility];  Service: Endoscopy;  Laterality: N/A;  at cecum at 0902 total withdrawal time    Family History  Problem Relation Age of Onset  . Diabetes Mother   . Heart disease Mother   . Stroke Mother   . Hypertension Mother   . Depression Father   . Cancer  Maternal Grandmother     breast    History  Substance Use Topics  . Smoking status: Current Some Day Smoker -- 0.50 packs/day for 4 years    Types: Cigarettes    Last Attempt to Quit: 07/20/2011  . Smokeless tobacco: Never Used     Comment: pt states less than 1/2 pack cig per week  . Alcohol Use: No    Allergies:  Allergies  Allergen Reactions  . Biaxin [Clarithromycin] Anaphylaxis and Swelling    Face swelling, thresh, throat closes up    Prescriptions prior to admission  Medication Sig Dispense Refill Last Dose  . alprazolam (XANAX) 2 MG tablet Take 2 mg by mouth 2 (two) times daily.    07/07/2014 at Unknown time  . amphetamine-dextroamphetamine (ADDERALL XR) 30 MG 24 hr capsule Take 30 mg by mouth daily.   Past Week at Unknown time  . cyclobenzaprine (FLEXERIL) 10 MG tablet Take 1 tablet (10 mg total) by mouth every 8 (eight) hours as needed for muscle spasms. 30 tablet 1 Past Week at Unknown time  . Prenatal Multivit-Min-Fe-FA (PRENATAL VITAMINS) 0.8 MG tablet Take 1 tablet by mouth daily. 30 tablet 12 07/06/2014 at Unknown time    ROS Physical Exam   Blood pressure 121/81, pulse 60, temperature 97.9 F (36.6 C), temperature source Oral, resp. rate 18, height  (1.575 m), weight 69.582 kg (  153 lb 6.4 oz), last menstrual period 11/03/2013, SpO2 98 %.  Physical Exam  Constitutional: She is oriented to person, place, and time. She appears well-developed and well-nourished.  HENT:  Head: Normocephalic.  Eyes: Pupils are equal, round, and reactive to light.  Neck: Normal range of motion.  Cardiovascular: Normal rate.   Respiratory: Effort normal.  GI: Soft.  33 cm fh  Genitourinary: Vagina normal.  gbs done  Musculoskeletal: Normal range of motion.  Neurological: She is alert and oriented to person, place, and time. She has normal reflexes.  Skin: Skin is warm and dry.  Psychiatric: She has a normal mood and affect. Her behavior is normal. Judgment and thought  content normal.    MAU Course  Procedures Nst nonreactive. BPP 4/10 MDM Review of labs, and fetal testing  Assessment and Plan  NONreassuring fetal status at 35w IUGR  <3%ile  Plan Cesarean delivery.   Pt informed of rationale, risks reviewed, pt agrees with recommended cesarean delivery OR notified  Eames Dibiasio V 07/08/2014, 1:03 AM

## 2014-07-08 NOTE — H&P (Addendum)
Carmen Burrow, MD Physician Signed Obstetrics MAU Note 07/08/2014 1:03 AM    Expand All Collapse All    History   Arrival to [facility] for eval for decreased fetal movement. Recent history of absent fetal growth over 2 consecutive growth u/s  CSN: [id number]  Arrival date and time: 07/07/14 2319  First Provider Initiated Contact with Patient 07/08/14 0055    Chief Complaint  Patient presents with  . Decreased Fetal Movement   HPI  18 yr G3 P0110 [redacted]w[redacted]d Presents with complaints of decreased fetal movement, arrives at MAU, where BPP is ordered and results are 4/8 2 each for AFI, breathing movements, without adequate fetal limb movements. EDF present. Pt takes Benzodiazepines, took Alprazolam  at midafternoon, which is a decreased dose from previous doses which she was on from Dr Betti Cruz at [facility],  Recent prenatal course notable for absence of fetal growth by last 2 u.s , most recently u.s on 12/29 showed unchanged fetal growth, with EFW at <3% for gest age, but BPP was 8/8 and Dopplers showed a RI of .55, indicating good doppler flow. Patient will be observed overnight, and BPP repeated in AM , and dopplers repeated. Pt advised NOT to take any more alprazolam or any sedative while here, until reeval. Addendum:  NST is nonreactive, making score 4/10 . Delivery felt indicated. Discussed with Dr Claudean Severance , who agrees with proceeding to delivery.  Last solids: doritos at 9 pm, liquid at 10:30.  Past Medical History  Diagnosis Date  . Anxiety   . Headache(784.0)   . Depression   . ADD (attention deficit disorder)   . Hx MRSA infection     in buttocks  . ADHD (attention deficit hyperactivity disorder) 11/02/2012    Past Surgical History  Procedure Laterality Date  . No past surgeries    . Wisdom tooth extraction  2014  . Colonoscopy with propofol N/A 09/29/2012    Procedure: COLONOSCOPY WITH PROPOFOL; Surgeon:  Malissa Hippo, MD; Location: [facility]; Service: Endoscopy; Laterality: N/A; at cecum at 0902 total withdrawal time    Family History  Problem Relation Age of Onset  . Diabetes Mother   . Heart disease Mother   . Stroke Mother   . Hypertension Mother   . Depression Father   . Cancer Maternal Grandmother     breast    History  Substance Use Topics  . Smoking status: Current Some Day Smoker -- 0.50 packs/day for 4 years    Types: Cigarettes    Last Attempt to Quit: 07/20/2011  . Smokeless tobacco: Never Used     Comment: pt states less than 1/2 pack cig per week  . Alcohol Use: No    Allergies:  Allergies  Allergen Reactions  . Biaxin [Clarithromycin] Anaphylaxis and Swelling    Face swelling, thresh, throat closes up    Prescriptions prior to admission  Medication Sig Dispense Refill Last Dose  . alprazolam (XANAX) 2 MG tablet Take 2 mg by mouth 2 (two) times daily.    07/07/2014 at Unknown time  . amphetamine-dextroamphetamine (ADDERALL XR) 30 MG 24 hr capsule Take 30 mg by mouth daily.   Past Week at Unknown time  . cyclobenzaprine (FLEXERIL) 10 MG tablet Take 1 tablet (10 mg total) by mouth every 8 (eight) hours as needed for muscle spasms. 30 tablet 1 Past Week at Unknown time  . Prenatal Multivit-Min-Fe-FA (PRENATAL VITAMINS) 0.8 MG tablet Take 1 tablet by mouth daily. 30 tablet 12 07/06/2014  at Unknown time    ROS Physical Exam   Blood pressure 121/81, pulse 60, temperature 97.9 F (36.6 C), temperature source Oral, resp. rate 18, height  (1.575 m), weight 69.582 kg (153 lb 6.4 oz), last menstrual period 11/03/2013, SpO2 98 %.  Physical Exam  Constitutional: She is oriented to person, place, and time. She appears well-developed and well-nourished.  HENT:  Head: Normocephalic.  Eyes: Pupils are equal, round, and reactive to light.  Neck: Normal  range of motion.  Cardiovascular: Normal rate.  Respiratory: Effort normal.  GI: Soft.  33 cm fh  Genitourinary: Vagina normal.  gbs done  Musculoskeletal: Normal range of motion.  Neurological: She is alert and oriented to person, place, and time. She has normal reflexes.  Skin: Skin is warm and dry.  Psychiatric: She has a normal mood and affect. Her behavior is normal. Judgment and thought content normal.    MAU Course  Procedures Nst nonreactive. BPP 4/10 MDM Review of labs, and fetal testing  Assessment and Plan  NONreassuring fetal status at 35w IUGR <3%ile  Plan Cesarean delivery.  Pt informed of rationale, risks reviewed, pt agrees with recommended cesarean delivery OR notified  Ocia Simek V 07/08/2014, 1:03 AM

## 2014-07-08 NOTE — Anesthesia Postprocedure Evaluation (Signed)
  Anesthesia Post-op Note  Patient: Carmen Martin  Procedure(s) Performed: Procedure(s): CESAREAN SECTION (N/A)  Patient Location: Women's Unit  Anesthesia Type:Spinal  Level of Consciousness: awake and alert   Airway and Oxygen Therapy: Patient Spontanous Breathing  Post-op Pain: mild  Post-op Assessment: Post-op Vital signs reviewed, Patient's Cardiovascular Status Stable, Respiratory Function Stable, No signs of Nausea or vomiting, Pain level controlled, No headache, No residual numbness and No residual motor weakness  Post-op Vital Signs: Reviewed  Last Vitals:  Filed Vitals:   07/08/14 1354  BP: 113/62  Pulse: 68  Temp: 37.1 C  Resp: 20    Complications: No apparent anesthesia complications

## 2014-07-08 NOTE — Op Note (Signed)
07/07/2014 - 07/08/2014  3:02 AM  PATIENT:  Carmen Martin  19 y.o. female  PRE-OPERATIVE DIAGNOSIS:  non reassuring fetal heart rate, IUGR, pregnancy 35+2 wk  POST-OPERATIVE DIAGNOSIS:  non reassuring fetal heart rate, IUGR, chronic placental abruption  PROCEDURE:  Procedure(s): CESAREAN SECTION (N/A) primary low transverse  SURGEON:  Surgeon(s) and Role:    * Tilda Burrow, MD - Primary  PHYSICIAN ASSISTANT:   ASSISTANTS: none   ANESTHESIA:   spinal  EBL:  Total I/O In: 1000 [I.V.:1000] Out: 700 [Urine:200; Blood:500]  BLOOD ADMINISTERED:none  DRAINS: Urinary Catheter (Foley)   LOCAL MEDICATIONS USED:  NONE  SPECIMEN:  Source of Specimen:  placenta to Pathology  DISPOSITION OF SPECIMEN:  PATHOLOGY  COUNTS:  YES  TOURNIQUET:  * No tourniquets in log *  DICTATION: .Dragon Dictation  PLAN OF CARE: Admit to inpatient   PATIENT DISPOSITION:  PACU - hemodynamically stable.   Delay start of Pharmacological VTE agent (>24hrs) due to surgical blood loss or risk of bleeding: not applicable Details of procedure:. Patient was taken to the operating room prepped and draped for lower abdominal surgery, timeout conducted. Ancef was administered. Transverse lower abdominal incision was made and method of Pfannenstiel, 12 cm incision and dissection to the fascia transverse opening of the fascia, and peritoneal cavity entered in the midline bluntly difficulty. Bladder flap was developed and lower uterine segment and transverse uterine incision performed, and fetal vertex guided to the incision. Amniotic fluid was watery meconium discolored. Her blood gas and cord blood samples were obtained and placenta delivered intact Carmen Martin presentation. Inspection the placenta showed a thickened cotyledon with approximately 8 cm chronic abruption approximately 4cm away from the placental cord insertion. Further inspection showed a separate small cotyledon on the opposite side with abruption that  appeared to been long-standing as well  . The left uterine vessel had a small pulsatile bleeder off the anterior surface of it, so the left uterine artery was doubly ligated, above and below the bleeding site. Hemostasis was confirmed.Uterus was irrigated out, and then closed with running locking 0 Monocryl, and then oversewn with a continuous running layer of the same. Bladder flap was loosely reapproximated with running 2-0 Vicryl. Abdomen was irrigated, anterior peritoneum closed with 2-0 Vicryl Fascia was closed with 0 Vicryl, subcutaneous tissues approximated with 2-0 Vicryl, then subcuticular 4-0 Vicryl used to close the skin incision. Sponge and needle counts were correct throughout 500 cc EBL.

## 2014-07-08 NOTE — Progress Notes (Signed)
Clinical Social Work Department PSYCHOSOCIAL ASSESSMENT - MATERNAL/CHILD 07/08/2014  Patient:  Carmen Martin,Carmen Martin  Account Number:  [id number]  Admit Date:  07/07/2014  Childs Name:   Undecided at time of CSW visit    Clinical Social Worker:  CUMI BEVEL, LCSW   Date/Time:  07/08/2014 10:30 AM  Date Referred:  07/08/2014   Referral source  NICU     Referred reason  Depression/Anxiety  NICU  Substance Abuse   Other referral source:    I:  FAMILY / HOME ENVIRONMENT Child's legal guardian:  PARENT  Guardian - Name Guardian - Age Guardian - Address  Carmen Martin,Carmen Martin 19 [address]  [place], [place] [address]  [place], [place] 23    Other household support members/support persons Other support:   paternal grandmother    II  PSYCHOSOCIAL DATA Information Source:    Financial and Community Resources Employment:   FOB works odd jobs   Financial resources:  Medicaid If Medicaid - County:   Other  WIC  Food Stamps   School / Grade:   Maternity Care Coordinator / Child Services Coordination / Early Interventions:  Cultural issues impacting care:    III  STRENGTHS Strengths  Supportive family/friends  Home prepared for Child (including basic supplies)  Adequate Resources   Strength comment:    IV  RISK FACTORS AND CURRENT PROBLEMS Current Problem:     Risk Factor & Current Problem Patient Issue Family Issue Risk Factor / Current Problem Comment  Mental Illness Y Martin Mother reports hx of bipolar and depression    V  SOCIAL WORK ASSESSMENT Acknowledged order for social work consult to assess mother's hx of substance abuse, and mental illness.  Met with mother, and she was receptive to CSW.  Informed that she is a single parent with no other dependents.  She and father reportedly maintain a supportive relationship. Mother states that maternal grandmother died August 2015 and she was her caregiver.  Maternal grandmother reportedly had many serious medical issues that required  round the clock care.  Mother states that maternal grandmother became ill when she was 19, and because of her social situation, she dropped out of school to care for her mother.  She became tearful as she spoke of her mother's passing.    She has not received any grief counseling and was encouraged to consider counseling.  She is reportedly being followed by Dr. Reddy, psychiatrist for medication management. Currently being prescribed Adderall and Xanax.  She also reported being prescribed flexeril.    Per chart review, mother has hx of substance abuse, and was positive for opiates on 07/04/14.  She denies any illicit drug use during pregnancy, admits to trying marijuana during early teens and denies taking any medication not prescribed to her despite being told of the positive UDS.   She could not explain why she was positive for opiates and insisted that she is not taking any medication not prescribed to her.  Reports only taking Tylenol for pain.  She was informed of the hospital's drug screen policy.   UDS on newborn was negative.   Mother reports hx of bipolar and depression since age 19.  Informed that she has taken medication in the past for the depression and bipolar, but has never found a medication that worked well. She also had brief counseling in the past, but poor follow through because transportation difficulties and other issues.  Mother states that she has learned how to manage the symptoms over the years   without medications.  She denies any hx of psychiatric hospitalization.   She denies any current SI, HI, or symptoms of depression or anxiety. She is excited about new born and communicate a strong desire to parent her child.  Mother states that she is also nervous about being a first time mother, but paternal relatives have been supportive.   She will be staying with paternal grandmother in [place] for about two weeks post hospitalization.   Informed that she has a poor relationship with her  siblings.  Mother informed of social work availability for continued support.        VI SOCIAL WORK PLAN Social Work Plan  Psychosocial Support/Ongoing Assessment of Needs   Type of pt/family education:   Resources available to NICU moms  PP Depression information and resources  Hospital's drug screen policy   If child protective services report - county:   If child protective services report - date:   Information/referral to community resources comment:   Other social work plan:   Will continue to monitor drug screen     

## 2014-07-09 LAB — RPR

## 2014-07-09 NOTE — Progress Notes (Signed)
Subjective: Postpartum Day 1(33 hours): Cesarean Delivery Patient reports incisional pain, tolerating PO, + flatus and no problems voiding.  She reports eating a cheeseburger and a quesadilla with no N/V.  Per nurse she has been going out to smoke.   Objective: Vital signs in last 24 hours: Temp:  [97.7 F (36.5 C)-98.9 F (37.2 C)] 97.7 F (36.5 C) (01/03 0558) Pulse Rate:  [51-68] 55 (01/03 0558) Resp:  [18-20] 20 (01/03 0558) BP: (102-126)/(55-79) 115/65 mmHg (01/03 0558) SpO2:  [98 %-99 %] 99 % (01/03 0558)  Physical Exam:  General: alert and no distress Lochia: appropriate Uterine Fundus: firm Incision: dressing with small amount of blood. This has been marked. DVT Evaluation: No evidence of DVT seen on physical exam.   Recent Labs  07/08/14 0050 07/08/14 0619  HGB 12.1 11.9*  HCT 34.8* 34.6*    Assessment/Plan: Status post Cesarean section. Doing well postoperatively.  Continue current care. Anticipate discharge in am  HARRAWAY-SMITH, Sahmir Weatherbee 07/09/2014, 11:01 AM

## 2014-07-10 ENCOUNTER — Encounter (HOSPITAL_COMMUNITY): Payer: Self-pay | Admitting: Obstetrics and Gynecology

## 2014-07-10 LAB — CULTURE, BETA STREP (GROUP B ONLY)

## 2014-07-10 MED ORDER — OXYCODONE-ACETAMINOPHEN 5-325 MG PO TABS: 1.0000 | ORAL_TABLET | ORAL | Status: DC | PRN

## 2014-07-10 MED ORDER — IBUPROFEN 600 MG PO TABS: 600.0000 mg | ORAL_TABLET | Freq: Four times a day (QID) | ORAL | Status: DC | PRN

## 2014-07-10 NOTE — Progress Notes (Signed)
Ur chart review completed.  

## 2014-07-10 NOTE — Progress Notes (Signed)
Pt ambulated out to nicu  And then will d/c home

## 2014-07-10 NOTE — Discharge Summary (Signed)
Obstetric Discharge Summary Reason for Admission: onset of labor Prenatal Procedures: ultrasound Intrapartum Procedures: cesarean: low cervical, transverse Postpartum Procedures: none Complications-Operative and Postpartum: none HEMOGLOBIN  Date Value Ref Range Status  07/08/2014 11.9* 12.0 - 15.0 g/dL Final   HCT  Date Value Ref Range Status  07/08/2014 34.6* 36.0 - 46.0 % Final    Physical Exam:  General: alert, cooperative, appears stated age and no distress Lochia: appropriate Uterine Fundus: firm Incision: healing well, no significant drainage, no dehiscence, no significant erythema DVT Evaluation: No evidence of DVT seen on physical exam. Negative Homan's sign. No cords or calf tenderness. No significant calf/ankle edema.  Discharge Diagnoses: Premature labor  Discharge Information: Date: 07/10/2014 Activity: pelvic rest Diet: routine Medications: PNV, Ibuprofen and Percocet Condition: stable and improved Instructions: refer to practice specific booklet Discharge to: home   Newborn Data: Live born female  Birth Weight: 3 lb 4.6 oz (1490 g) APGAR: 7, 8  Home with NICU.  Carmen Martin, Carmen Martin 07/10/2014, 7:10 AM

## 2014-07-12 ENCOUNTER — Ambulatory Visit (INDEPENDENT_AMBULATORY_CARE_PROVIDER_SITE_OTHER): Payer: Medicaid Other | Admitting: Obstetrics & Gynecology

## 2014-07-12 ENCOUNTER — Encounter: Payer: Self-pay | Admitting: Obstetrics & Gynecology

## 2014-07-12 VITALS — BP 120/90 | Wt 149.0 lb

## 2014-07-12 DIAGNOSIS — Z9889 Other specified postprocedural states: Secondary | ICD-10-CM

## 2014-07-12 NOTE — Progress Notes (Signed)
Patient ID: Carmen BueKristin N Martin, female   DOB: 06/14/1996, 19 y.o.   MRN: [id number]  HPI: Patient returns for routine postoperative follow-up having undergone Caesarean section  on 07/08/2014. The patient's early postoperative recovery while in the hospital was notable for na. Since hospital discharge the patient reports unremarkable.   Current Outpatient Prescriptions  Medication Sig Dispense Refill  . alprazolam (XANAX) 2 MG tablet Take 2 mg by mouth 2 (two) times daily.     Marland Kitchen. amphetamine-dextroamphetamine (ADDERALL XR) 30 MG 24 hr capsule Take 30 mg by mouth daily.    Marland Kitchen. oxyCODONE-acetaminophen (PERCOCET/ROXICET) 5-325 MG per tablet Take 1 tablet by mouth every 4 (four) hours as needed (for pain scale less than 7). 30 tablet 0  . cyclobenzaprine (FLEXERIL) 10 MG tablet Take 1 tablet (10 mg total) by mouth every 8 (eight) hours as needed for muscle spasms. (Patient not taking: Reported on 07/12/2014) 30 tablet 1  . ibuprofen (ADVIL,MOTRIN) 600 MG tablet Take 1 tablet (600 mg total) by mouth every 6 (six) hours as needed for mild pain. (Patient not taking: Reported on 07/12/2014) 30 tablet 0  . Prenatal Multivit-Min-Fe-FA (PRENATAL VITAMINS) 0.8 MG tablet Take 1 tablet by mouth daily. (Patient not taking: Reported on 07/12/2014) 30 tablet 12   No current facility-administered medications for this visit.    Physical Exam: Incision clean dry intact   Diagnostic Tests: none  Impression: Normal post op visit  Plan: Pp exam in 5 weeks

## 2014-07-13 ENCOUNTER — Other Ambulatory Visit: Payer: Medicaid Other

## 2014-07-13 ENCOUNTER — Encounter: Payer: Medicaid Other | Admitting: Advanced Practice Midwife

## 2014-07-17 NOTE — Addendum Note (Signed)
Addendum  created 07/17/14 1028 by Leilani AbleFranklin Yadir Zentner, MD   Modules edited: Anesthesia Responsible Staff

## 2014-07-18 LAB — US OB FOLLOW UP

## 2014-07-27 ENCOUNTER — Telehealth: Payer: Self-pay | Admitting: Advanced Practice Midwife

## 2014-07-27 NOTE — Telephone Encounter (Signed)
Pt states that she had a c-section on 07/08/14. Pt states that she has been crying, and edgy, and having headache since delivery. Pt denies any thoughts of harming herself or her baby. I advised the pt that she needs to come in the office and discuss this with a provider. Phone call was switched up front to make an appointment for Monday.

## 2014-07-31 ENCOUNTER — Encounter: Payer: Self-pay | Admitting: Obstetrics and Gynecology

## 2014-07-31 ENCOUNTER — Ambulatory Visit: Payer: Medicaid Other | Admitting: Obstetrics and Gynecology

## 2014-08-05 ENCOUNTER — Encounter (HOSPITAL_COMMUNITY): Payer: Self-pay | Admitting: Emergency Medicine

## 2014-08-05 ENCOUNTER — Emergency Department (HOSPITAL_COMMUNITY): Payer: Medicaid Other

## 2014-08-05 ENCOUNTER — Emergency Department (HOSPITAL_COMMUNITY)
Admission: EM | Admit: 2014-08-05 | Discharge: 2014-08-05 | Disposition: A | Discharge status: Home / Self Care | Payer: Medicaid Other | Attending: Emergency Medicine | Admitting: Emergency Medicine

## 2014-08-05 DIAGNOSIS — R110 Nausea: Secondary | ICD-10-CM | POA: Insufficient documentation

## 2014-08-05 DIAGNOSIS — F909 Attention-deficit hyperactivity disorder, unspecified type: Secondary | ICD-10-CM | POA: Diagnosis not present

## 2014-08-05 DIAGNOSIS — Z720 Tobacco use: Secondary | ICD-10-CM | POA: Insufficient documentation

## 2014-08-05 DIAGNOSIS — Z79899 Other long term (current) drug therapy: Secondary | ICD-10-CM | POA: Insufficient documentation

## 2014-08-05 DIAGNOSIS — Z9889 Other specified postprocedural states: Secondary | ICD-10-CM | POA: Insufficient documentation

## 2014-08-05 DIAGNOSIS — R109 Unspecified abdominal pain: Secondary | ICD-10-CM

## 2014-08-05 DIAGNOSIS — Z8614 Personal history of Methicillin resistant Staphylococcus aureus infection: Secondary | ICD-10-CM | POA: Insufficient documentation

## 2014-08-05 DIAGNOSIS — Y998 Other external cause status: Secondary | ICD-10-CM | POA: Insufficient documentation

## 2014-08-05 DIAGNOSIS — Y9389 Activity, other specified: Secondary | ICD-10-CM | POA: Diagnosis not present

## 2014-08-05 DIAGNOSIS — F329 Major depressive disorder, single episode, unspecified: Secondary | ICD-10-CM | POA: Diagnosis not present

## 2014-08-05 DIAGNOSIS — F419 Anxiety disorder, unspecified: Secondary | ICD-10-CM | POA: Diagnosis not present

## 2014-08-05 DIAGNOSIS — W228XXA Striking against or struck by other objects, initial encounter: Secondary | ICD-10-CM | POA: Insufficient documentation

## 2014-08-05 DIAGNOSIS — Y92512 Supermarket, store or market as the place of occurrence of the external cause: Secondary | ICD-10-CM | POA: Insufficient documentation

## 2014-08-05 DIAGNOSIS — S3991XA Unspecified injury of abdomen, initial encounter: Secondary | ICD-10-CM | POA: Insufficient documentation

## 2014-08-05 LAB — BASIC METABOLIC PANEL
ANION GAP: 4 — AB (ref 5–15)
BUN: 7 mg/dL (ref 6–23)
CALCIUM: 9 mg/dL (ref 8.4–10.5)
CHLORIDE: 108 mmol/L (ref 96–112)
CO2: 27 mmol/L (ref 19–32)
Creatinine, Ser: 0.68 mg/dL (ref 0.50–1.10)
Glucose, Bld: 82 mg/dL (ref 70–99)
POTASSIUM: 4.1 mmol/L (ref 3.5–5.1)
Sodium: 139 mmol/L (ref 135–145)

## 2014-08-05 LAB — CBC WITH DIFFERENTIAL/PLATELET
BASOS ABS: 0 10*3/uL (ref 0.0–0.1)
BASOS PCT: 1 % (ref 0–1)
EOS ABS: 0.2 10*3/uL (ref 0.0–0.7)
Eosinophils Relative: 4 % (ref 0–5)
HCT: 38.4 % (ref 36.0–46.0)
Hemoglobin: 12.4 g/dL (ref 12.0–15.0)
Lymphocytes Relative: 43 % (ref 12–46)
Lymphs Abs: 2.5 10*3/uL (ref 0.7–4.0)
MCH: 29.5 pg (ref 26.0–34.0)
MCHC: 32.3 g/dL (ref 30.0–36.0)
MCV: 91.4 fL (ref 78.0–100.0)
MONO ABS: 0.3 10*3/uL (ref 0.1–1.0)
Monocytes Relative: 6 % (ref 3–12)
NEUTROS ABS: 2.7 10*3/uL (ref 1.7–7.7)
NEUTROS PCT: 46 % (ref 43–77)
Platelets: 240 10*3/uL (ref 150–400)
RBC: 4.2 MIL/uL (ref 3.87–5.11)
RDW: 13.5 % (ref 11.5–15.5)
WBC: 5.8 10*3/uL (ref 4.0–10.5)

## 2014-08-05 LAB — URINALYSIS, ROUTINE W REFLEX MICROSCOPIC
BILIRUBIN URINE: NEGATIVE
Glucose, UA: NEGATIVE mg/dL
HGB URINE DIPSTICK: NEGATIVE
KETONES UR: NEGATIVE mg/dL
Leukocytes, UA: NEGATIVE
NITRITE: NEGATIVE
PH: 7 (ref 5.0–8.0)
Protein, ur: NEGATIVE mg/dL
Specific Gravity, Urine: 1.01 (ref 1.005–1.030)
Urobilinogen, UA: 0.2 mg/dL (ref 0.0–1.0)

## 2014-08-05 MED ORDER — HYDROMORPHONE HCL 1 MG/ML IJ SOLN
1.0000 mg | Freq: Once | INTRAMUSCULAR | Status: AC
  Administered 2014-08-05: 1 mg via INTRAVENOUS
  Filled 2014-08-05: qty 1

## 2014-08-05 MED ORDER — IOHEXOL 300 MG/ML  SOLN
50.0000 mL | Freq: Once | INTRAMUSCULAR | Status: AC | PRN
  Administered 2014-08-05: 50 mL via ORAL

## 2014-08-05 MED ORDER — SODIUM CHLORIDE 0.9 % IV SOLN: INTRAVENOUS | Status: DC

## 2014-08-05 MED ORDER — IOHEXOL 300 MG/ML  SOLN
100.0000 mL | Freq: Once | INTRAMUSCULAR | Status: AC | PRN
  Administered 2014-08-05: 100 mL via INTRAVENOUS

## 2014-08-05 MED ORDER — ONDANSETRON HCL 4 MG/2ML IJ SOLN
4.0000 mg | Freq: Once | INTRAMUSCULAR | Status: AC
  Administered 2014-08-05: 4 mg via INTRAVENOUS
  Filled 2014-08-05: qty 2

## 2014-08-05 MED ORDER — SODIUM CHLORIDE 0.9 % IV BOLUS (SEPSIS)
500.0000 mL | Freq: Once | INTRAVENOUS | Status: AC
  Administered 2014-08-05: 500 mL via INTRAVENOUS

## 2014-08-05 NOTE — ED Notes (Signed)
Discharge instructions given, pt demonstrated teach back and verbal understanding. No concerns voiced.  

## 2014-08-05 NOTE — ED Notes (Signed)
Pt reports had c-section x3 weeks ago. Pt reports hit her abdomen on a grocery store buggy. Pt reports ever since has had abdominal pain,nausea and "bloated" feeling.

## 2014-08-05 NOTE — Discharge Instructions (Signed)
CT scan without evidence of any significant injury related to the C-section. Okay to go home. Recommend taking Motrin. Return for any new or worse symptoms.

## 2014-08-05 NOTE — ED Notes (Signed)
Pt states "pulling and burning" sensation to lower abdomen/suprapubic area after hitting surgical site 2 days ago on a grocery cart. Incision is CDI and has healed well.

## 2014-08-05 NOTE — ED Provider Notes (Signed)
CSN: [id number]     Arrival date & time 08/05/14  1649 History   First MD Initiated Contact with Patient 08/05/14 1730     Chief Complaint  Patient presents with  . Abdominal Pain     (Consider location/radiation/quality/duration/timing/severity/associated sxs/prior Treatment) Patient is a 19 y.o. female presenting with abdominal pain. The history is provided by the patient.  Abdominal Pain Associated symptoms: nausea   Associated symptoms: no chest pain, no fever, no shortness of breath and no vomiting    patient status post C-section 3 weeks ago. Patient was a grocery store yesterday got hit hard in the abdomen by a shopping cart. Since at times had discomfort at her C-section incision site and has had nausea. Patient is concerned that there is been some injury. Seems to have a little fullness in that area as well. Patient states the pain is 5 out of 10. No back pain no increased vaginal bleeding. No vomiting. No syncope. No fevers. Patient is not breast-feeding.  Past Medical History  Diagnosis Date  . Anxiety   . Headache(784.0)   . Depression   . ADD (attention deficit disorder)   . Hx MRSA infection     in buttocks  . ADHD (attention deficit hyperactivity disorder) 11/02/2012   Past Surgical History  Procedure Laterality Date  . No past surgeries    . Wisdom tooth extraction  2014  . Colonoscopy with propofol N/A 09/29/2012    Procedure: COLONOSCOPY WITH PROPOFOL;  Surgeon: Malissa HippoNajeeb U Rehman, MD;  Location: [facility];  Service: Endoscopy;  Laterality: N/A;  at cecum at 0902 total withdrawal time 8minutes  . Cesarean section N/A 07/08/2014    Procedure: CESAREAN SECTION;  Surgeon: Tilda BurrowJohn Ferguson V, MD;  Location: [facility];  Service: Obstetrics;  Laterality: N/A;   Family History  Problem Relation Age of Onset  . Diabetes Mother   . Heart disease Mother   . Stroke Mother   . Hypertension Mother   . Depression Father   . Cancer Maternal Grandmother     breast   History   Substance Use Topics  . Smoking status: Current Some Day Smoker -- 0.50 packs/day for 4 years    Types: Cigarettes    Last Attempt to Quit: 07/20/2011  . Smokeless tobacco: Never Used     Comment: pt states less than 1/2 pack cig per week  . Alcohol Use: No   OB History    Gravida Para Term Preterm AB TAB SAB Ectopic Multiple Living   3 2 0 2 1 0 1 0 0 1      Review of Systems  Constitutional: Negative for fever.  HENT: Negative for congestion.   Eyes: Negative for redness and visual disturbance.  Respiratory: Negative for shortness of breath.   Cardiovascular: Negative for chest pain.  Gastrointestinal: Positive for nausea and abdominal pain. Negative for vomiting.  Genitourinary:       No increased vaginal bleeding.  Musculoskeletal: Negative for back pain.  Skin: Positive for wound. Negative for rash.  Neurological: Negative for headaches.  Hematological: Does not bruise/bleed easily.  Psychiatric/Behavioral: Negative for confusion.      Allergies  Biaxin  Home Medications   Prior to Admission medications   Medication Sig Start Date End Date Taking? Authorizing Provider  alprazolam Prudy Feeler(XANAX) 2 MG tablet Take 2 mg by mouth 2 (two) times daily.    Yes Marcellina MillinKeshavpal Gunna Reddy, MD  amphetamine-dextroamphetamine (ADDERALL XR) 30 MG 24 hr capsule Take 30 mg by mouth daily.  Yes Marcellina Millin, MD  ibuprofen (ADVIL,MOTRIN) 200 MG tablet Take 200 mg by mouth every 4 (four) hours as needed for mild pain.   Yes Historical Provider, MD  ibuprofen (ADVIL,MOTRIN) 600 MG tablet Take 1 tablet (600 mg total) by mouth every 6 (six) hours as needed for mild pain. Patient not taking: Reported on 07/12/2014 07/10/14   Ferdie Ping, CNM  oxyCODONE-acetaminophen (PERCOCET/ROXICET) 5-325 MG per tablet Take 1 tablet by mouth every 4 (four) hours as needed (for pain scale less than 7). 07/10/14   Ferdie Ping, CNM  Prenatal Multivit-Min-Fe-FA (PRENATAL VITAMINS) 0.8 MG tablet  Take 1 tablet by mouth daily. Patient not taking: Reported on 07/12/2014 03/21/14   Jacklyn Shell, CNM   BP 106/77 mmHg  Pulse 75  Temp(Src) 98.6 F (37 C) (Oral)  Resp 15  Ht  (1.549 m)  Wt 140 lb (63.504 kg)  BMI 26.47 kg/m2  SpO2 100%  Breastfeeding? No Physical Exam  Constitutional: She is oriented to person, place, and time. She appears well-developed and well-nourished. No distress.  HENT:  Head: Normocephalic and atraumatic.  Eyes: Conjunctivae and EOM are normal. Pupils are equal, round, and reactive to light.  Neck: Normal range of motion.  Cardiovascular: Normal rate, regular rhythm and normal heart sounds.   No murmur heard. Pulmonary/Chest: Effort normal and breath sounds normal. No respiratory distress.  Abdominal: Soft. Bowel sounds are normal. There is tenderness.  C-section incision is healing well without evidence of an infection no open wound of any significance. Mild tenderness to palpation around the incision.  Musculoskeletal: Normal range of motion.  Neurological: She is alert and oriented to person, place, and time. No cranial nerve deficit. She exhibits normal muscle tone. Coordination normal.  Skin: Skin is warm.  Nursing note and vitals reviewed.   ED Course  Procedures (including critical care time) Labs Review Labs Reviewed  BASIC METABOLIC PANEL - Abnormal; Notable for the following:    Anion gap 4 (*)    All other components within normal limits  CBC WITH DIFFERENTIAL/PLATELET  URINALYSIS, ROUTINE W REFLEX MICROSCOPIC   Results for orders placed or performed during the hospital encounter of 08/05/14  Basic metabolic panel  Result Value Ref Range   Sodium 139 135 - 145 mmol/L   Potassium 4.1 3.5 - 5.1 mmol/L   Chloride 108 96 - 112 mmol/L   CO2 27 19 - 32 mmol/L   Glucose, Bld 82 70 - 99 mg/dL   BUN 7 6 - 23 mg/dL   Creatinine, Ser 1.61 0.50 - 1.10 mg/dL   Calcium 9.0 8.4 - 09.6 mg/dL   GFR calc non Af Amer >90 >90 mL/min    GFR calc Af Amer >90 >90 mL/min   Anion gap 4 (L) 5 - 15  CBC with Differential/Platelet  Result Value Ref Range   WBC 5.8 4.0 - 10.5 K/uL   RBC 4.20 3.87 - 5.11 MIL/uL   Hemoglobin 12.4 12.0 - 15.0 g/dL   HCT 04.5 40.9 - 81.1 %   MCV 91.4 78.0 - 100.0 fL   MCH 29.5 26.0 - 34.0 pg   MCHC 32.3 30.0 - 36.0 g/dL   RDW 91.4 78.2 - 95.6 %   Platelets 240 150 - 400 K/uL   Neutrophils Relative % 46 43 - 77 %   Neutro Abs 2.7 1.7 - 7.7 K/uL   Lymphocytes Relative 43 12 - 46 %   Lymphs Abs 2.5 0.7 - 4.0 K/uL   Monocytes  Relative 6 3 - 12 %   Monocytes Absolute 0.3 0.1 - 1.0 K/uL   Eosinophils Relative 4 0 - 5 %   Eosinophils Absolute 0.2 0.0 - 0.7 K/uL   Basophils Relative 1 0 - 1 %   Basophils Absolute 0.0 0.0 - 0.1 K/uL  Urinalysis, Routine w reflex microscopic  Result Value Ref Range   Color, Urine YELLOW YELLOW   APPearance CLEAR CLEAR   Specific Gravity, Urine 1.010 1.005 - 1.030   pH 7.0 5.0 - 8.0   Glucose, UA NEGATIVE NEGATIVE mg/dL   Hgb urine dipstick NEGATIVE NEGATIVE   Bilirubin Urine NEGATIVE NEGATIVE   Ketones, ur NEGATIVE NEGATIVE mg/dL   Protein, ur NEGATIVE NEGATIVE mg/dL   Urobilinogen, UA 0.2 0.0 - 1.0 mg/dL   Nitrite NEGATIVE NEGATIVE   Leukocytes, UA NEGATIVE NEGATIVE     Imaging Review Ct Abdomen Pelvis W Contrast  08/05/2014   CLINICAL DATA:  Pain after trauma to abdomen  EXAM: CT ABDOMEN AND PELVIS WITH CONTRAST  TECHNIQUE: Multidetector CT imaging of the abdomen and pelvis was performed using the standard protocol following bolus administration of intravenous contrast. Oral contrast was also administered.  CONTRAST:  50mL OMNIPAQUE IOHEXOL 300 MG/ML SOLN, OMNIPAQUE IOHEXOL 300 MG/ML SOLN  COMPARISON:  October 07, 2013  FINDINGS: Lung bases are clear.  Liver appears intact without laceration or rupture. There is no perihepatic fluid. No focal liver lesions are identified. Gallbladder wall is not thickened. There is no biliary duct dilatation.  Spleen  appears intact without laceration or rupture. There is no perisplenic fluid. No focal splenic lesions are identified.  Pancreas and adrenals appear within normal limits. Kidneys bilaterally show no mass or hydronephrosis on either side. There is no renal or ureteral calculus on either side. There is no renal laceration or rupture. No contrast extravasation or perinephric fluid.  In the pelvis, the urinary bladder is midline with normal wall thickness. There is no pelvic mass or pelvic fluid collection. Appendix appears normal.  There is no abdominal wall or pelvic wall thickening. There is no intramuscular or retroperitoneal hematoma.  There is no bowel obstruction. No free air or portal venous air. There is no bowel wall or mesenteric thickening. There is no ascites, adenopathy, or abscess in the abdomen pelvis. Aorta appears normal. There is no periaortic fluid. No fractures are apparent. There is no blastic or lytic bone lesion.  IMPRESSION: There is no demonstrable traumatic or inflammatory lesion. No abnormality noted.   Electronically Signed   By: Bretta Bang M.D.   On: 08/05/2014 20:26     EKG Interpretation None      MDM   Final diagnoses:  Abdominal pain    Patient status post C-section 3 weeks ago. Yesterday was bumped hard in the abdomen with a shopping cart. Was concerned that she did some injuries to her C-section area. She's had some mild nausea and persistent pain in the area. Wound did not open up no bleeding. CT scan negative for any acute findings. Labs without any significant abnormalities. Patient has been taking Motrin at home. She is not breast-feeding.    Vanetta Mulders, MD 08/05/14 [phone]

## 2014-08-08 ENCOUNTER — Ambulatory Visit: Payer: Medicaid Other | Admitting: Women's Health

## 2014-08-14 ENCOUNTER — Ambulatory Visit: Payer: Medicaid Other | Admitting: Women's Health

## 2014-08-14 ENCOUNTER — Encounter: Payer: Self-pay | Admitting: *Deleted

## 2014-08-31 ENCOUNTER — Encounter (HOSPITAL_COMMUNITY): Payer: Self-pay | Admitting: Emergency Medicine

## 2014-08-31 ENCOUNTER — Emergency Department (HOSPITAL_COMMUNITY)
Admission: EM | Admit: 2014-08-31 | Discharge: 2014-08-31 | Discharge status: Against Medical Advice | Payer: Medicaid Other | Attending: Emergency Medicine | Admitting: Emergency Medicine

## 2014-08-31 DIAGNOSIS — L02214 Cutaneous abscess of groin: Secondary | ICD-10-CM | POA: Insufficient documentation

## 2014-08-31 DIAGNOSIS — Z720 Tobacco use: Secondary | ICD-10-CM | POA: Insufficient documentation

## 2014-08-31 NOTE — ED Notes (Signed)
Abscess left side groin, painful, not draining

## 2014-09-03 ENCOUNTER — Encounter (HOSPITAL_COMMUNITY): Payer: Self-pay | Admitting: Emergency Medicine

## 2014-09-03 ENCOUNTER — Emergency Department (HOSPITAL_COMMUNITY)
Admission: EM | Admit: 2014-09-03 | Discharge: 2014-09-04 | Disposition: A | Discharge status: Home / Self Care | Payer: Medicaid Other | Attending: Emergency Medicine | Admitting: Emergency Medicine

## 2014-09-03 DIAGNOSIS — F419 Anxiety disorder, unspecified: Secondary | ICD-10-CM | POA: Insufficient documentation

## 2014-09-03 DIAGNOSIS — Z79899 Other long term (current) drug therapy: Secondary | ICD-10-CM | POA: Insufficient documentation

## 2014-09-03 DIAGNOSIS — F909 Attention-deficit hyperactivity disorder, unspecified type: Secondary | ICD-10-CM | POA: Diagnosis not present

## 2014-09-03 DIAGNOSIS — Z8614 Personal history of Methicillin resistant Staphylococcus aureus infection: Secondary | ICD-10-CM | POA: Diagnosis not present

## 2014-09-03 DIAGNOSIS — L0231 Cutaneous abscess of buttock: Secondary | ICD-10-CM | POA: Diagnosis present

## 2014-09-03 DIAGNOSIS — F329 Major depressive disorder, single episode, unspecified: Secondary | ICD-10-CM | POA: Insufficient documentation

## 2014-09-03 DIAGNOSIS — Z720 Tobacco use: Secondary | ICD-10-CM | POA: Diagnosis not present

## 2014-09-03 NOTE — ED Notes (Signed)
Patient c/o vaginal abscess and an abscess on her butt cheek.

## 2014-09-04 MED ORDER — IBUPROFEN 600 MG PO TABS: 600.0000 mg | ORAL_TABLET | Freq: Four times a day (QID) | ORAL | Status: DC | PRN

## 2014-09-04 MED ORDER — SULFAMETHOXAZOLE-TRIMETHOPRIM 800-160 MG PO TABS: 1.0000 | ORAL_TABLET | Freq: Two times a day (BID) | ORAL | Status: DC

## 2014-09-04 MED ORDER — SULFAMETHOXAZOLE-TRIMETHOPRIM 800-160 MG PO TABS
1.0000 | ORAL_TABLET | Freq: Once | ORAL | Status: AC
  Administered 2014-09-04: 1 via ORAL
  Filled 2014-09-04: qty 1

## 2014-09-04 MED ORDER — LIDOCAINE HCL (PF) 1 % IJ SOLN
INTRAMUSCULAR | Status: AC
  Filled 2014-09-04: qty 5

## 2014-09-04 NOTE — ED Notes (Signed)
Discharge instructions given, pt demonstrated teach back and verbal understanding. No concerns voiced.  

## 2014-09-04 NOTE — ED Provider Notes (Signed)
CSN: [id number]     Arrival date & time 09/03/14  2241 History   First MD Initiated Contact with Patient 09/03/14 2357     Chief Complaint  Patient presents with  . Abscess     (Consider location/radiation/quality/duration/timing/severity/associated sxs/prior Treatment) Patient is a 19 y.o. female presenting with abscess. The history is provided by the patient.  Abscess Location:  Ano-genital Ano-genital abscess location:  R buttock and vulva Size:  Buttock lesion indurated with surrounding erythema - 2 cm,  vulvar lesions (2) less than 1/2 cm and soft Red streaking: no   Duration:  1 week Progression:  Worsening Chronicity:  Recurrent Relieved by:  Nothing Worsened by:  Draining/squeezing Ineffective treatments:  Warm compresses Associated symptoms: no fever   Risk factors: hx of MRSA     Past Medical History  Diagnosis Date  . Anxiety   . Headache(784.0)   . Depression   . ADD (attention deficit disorder)   . Hx MRSA infection     in buttocks  . ADHD (attention deficit hyperactivity disorder) 11/02/2012   Past Surgical History  Procedure Laterality Date  . No past surgeries    . Wisdom tooth extraction  2014  . Colonoscopy with propofol N/A 09/29/2012    Procedure: COLONOSCOPY WITH PROPOFOL;  Surgeon: Malissa Hippo, MD;  Location: [facility];  Service: Endoscopy;  Laterality: N/A;  at cecum at 0902 total withdrawal time  . Cesarean section N/A 07/08/2014    Procedure: CESAREAN SECTION;  Surgeon: Tilda Burrow, MD;  Location: [facility];  Service: Obstetrics;  Laterality: N/A;   Family History  Problem Relation Age of Onset  . Diabetes Mother   . Heart disease Mother   . Stroke Mother   . Hypertension Mother   . Depression Father   . Cancer Maternal Grandmother     breast   History  Substance Use Topics  . Smoking status: Current Some Day Smoker -- 0.50 packs/day for 4 years    Types: Cigarettes    Last Attempt to Quit: 07/20/2011  . Smokeless tobacco:  Never Used     Comment: pt states less than 1/2 pack cig per week  . Alcohol Use: No   OB History    Gravida Para Term Preterm AB TAB SAB Ectopic Multiple Living       Review of Systems  Constitutional: Negative for fever and chills.  Respiratory: Negative for shortness of breath and wheezing.   Skin:       Negative except as mentioned in HPI.        Allergies  Biaxin  Home Medications   Prior to Admission medications   Medication Sig Start Date End Date Taking? Authorizing Provider  alprazolam Prudy Feeler) 2 MG tablet Take 2 mg by mouth 2 (two) times daily.     Marcellina Millin, MD  amphetamine-dextroamphetamine (ADDERALL XR) 30 MG 24 hr capsule Take 30 mg by mouth daily.    Marcellina Millin, MD  ibuprofen (ADVIL,MOTRIN) 600 MG tablet Take 1 tablet (600 mg total) by mouth every 6 (six) hours as needed. 09/04/14   Burgess Amor, PA-C  oxyCODONE-acetaminophen (PERCOCET/ROXICET) 5-325 MG per tablet Take 1 tablet by mouth every 4 (four) hours as needed (for pain scale less than 7). 07/10/14   Ferdie Ping, CNM  Prenatal Multivit-Min-Fe-FA (PRENATAL VITAMINS) 0.8 MG tablet Take 1 tablet by mouth daily. Patient not taking: Reported on 07/12/2014 03/21/14  Jacklyn ShellFrances Cresenzo-Dishmon, CNM  sulfamethoxazole-trimethoprim (SEPTRA DS) 800-160 MG per tablet Take 1 tablet by mouth every 12 (twelve) hours. 09/04/14   Burgess AmorJulie Aariv Medlock, PA-C   BP 118/75 mmHg  Pulse 66  Temp(Src) 98.7 F (37.1 C) (Oral)  Resp 20  Ht 5\' 2"  (1.575 m)  Wt 142 lb (64.411 kg)  BMI 25.97 kg/m2  SpO2 96%  LMP 08/21/2014 Physical Exam  Constitutional: She is oriented to person, place, and time. She appears well-developed and well-nourished.  HENT:  Head: Normocephalic.  Cardiovascular: Normal rate.   Pulmonary/Chest: Effort normal.  Neurological: She is alert and oriented to person, place, and time. No sensory deficit.  Skin:  2 small, soft slightly tender papules on vulva.  Tender,  indurated 2 cm abscess right mid buttock, central shallow ulceration, no drainage.    ED Course  Procedures (including critical care time)  INCISION AND DRAINAGE Performed by: Burgess AmorIDOL, Zadyn Yardley Consent: Verbal consent obtained. Risks and benefits: risks, benefits and alternatives were discussed Type: abscess  Body area: buttock  Anesthesia: local infiltration  Incision was made with a scalpel.  Local anesthetic: lidocaine 1% without epinephrine  Anesthetic total: 3 ml  Complexity: complex Blunt dissection to break up loculations  Drainage: purulent  Drainage amount: scant  Packing material: none  Patient tolerance: Patient tolerated the procedure well with no immediate complications.    Labs Review Labs Reviewed - No data to display  Imaging Review No results found.   EKG Interpretation None      MDM   Final diagnoses:  Abscess of buttock, right    Warm soaks.  Pt placed on bactrim, ibuprofen prescribed for pain.  Prn f/u anticipated. Advised recheck for any worsening pain, swelling, red streaking, fevers.Burgess Amor.      Kalkidan Caudell, PA-C 09/04/14 1340  Ward GivensIva L Knapp, MD 09/06/14 2303

## 2014-09-04 NOTE — Discharge Instructions (Signed)

## 2014-09-06 NOTE — Progress Notes (Signed)
This encounter was created in error - please disregard.

## 2014-09-24 ENCOUNTER — Emergency Department (HOSPITAL_COMMUNITY)
Admission: EM | Admit: 2014-09-24 | Discharge: 2014-09-24 | Disposition: A | Discharge status: Home / Self Care | Payer: Medicaid Other | Attending: Emergency Medicine | Admitting: Emergency Medicine

## 2014-09-24 ENCOUNTER — Encounter (HOSPITAL_COMMUNITY): Payer: Self-pay

## 2014-09-24 DIAGNOSIS — Z720 Tobacco use: Secondary | ICD-10-CM | POA: Insufficient documentation

## 2014-09-24 DIAGNOSIS — F329 Major depressive disorder, single episode, unspecified: Secondary | ICD-10-CM | POA: Insufficient documentation

## 2014-09-24 DIAGNOSIS — N939 Abnormal uterine and vaginal bleeding, unspecified: Secondary | ICD-10-CM | POA: Diagnosis present

## 2014-09-24 DIAGNOSIS — Z8614 Personal history of Methicillin resistant Staphylococcus aureus infection: Secondary | ICD-10-CM | POA: Diagnosis not present

## 2014-09-24 DIAGNOSIS — M545 Low back pain: Secondary | ICD-10-CM | POA: Insufficient documentation

## 2014-09-24 DIAGNOSIS — F909 Attention-deficit hyperactivity disorder, unspecified type: Secondary | ICD-10-CM | POA: Diagnosis not present

## 2014-09-24 DIAGNOSIS — R1084 Generalized abdominal pain: Secondary | ICD-10-CM | POA: Insufficient documentation

## 2014-09-24 DIAGNOSIS — Z3202 Encounter for pregnancy test, result negative: Secondary | ICD-10-CM | POA: Insufficient documentation

## 2014-09-24 DIAGNOSIS — F419 Anxiety disorder, unspecified: Secondary | ICD-10-CM | POA: Diagnosis not present

## 2014-09-24 LAB — CBC WITH DIFFERENTIAL/PLATELET
BASOS PCT: 0 % (ref 0–1)
Basophils Absolute: 0 10*3/uL (ref 0.0–0.1)
Eosinophils Absolute: 0.4 10*3/uL (ref 0.0–0.7)
Eosinophils Relative: 5 % (ref 0–5)
HCT: 36.2 % (ref 36.0–46.0)
Hemoglobin: 11.7 g/dL — ABNORMAL LOW (ref 12.0–15.0)
LYMPHS ABS: 2.6 10*3/uL (ref 0.7–4.0)
Lymphocytes Relative: 36 % (ref 12–46)
MCH: 28.3 pg (ref 26.0–34.0)
MCHC: 32.3 g/dL (ref 30.0–36.0)
MCV: 87.7 fL (ref 78.0–100.0)
MONOS PCT: 6 % (ref 3–12)
Monocytes Absolute: 0.5 10*3/uL (ref 0.1–1.0)
NEUTROS ABS: 3.8 10*3/uL (ref 1.7–7.7)
Neutrophils Relative %: 53 % (ref 43–77)
PLATELETS: 257 10*3/uL (ref 150–400)
RBC: 4.13 MIL/uL (ref 3.87–5.11)
RDW: 13.5 % (ref 11.5–15.5)
WBC: 7.3 10*3/uL (ref 4.0–10.5)

## 2014-09-24 LAB — WET PREP, GENITAL
CLUE CELLS WET PREP: NONE SEEN
TRICH WET PREP: NONE SEEN
YEAST WET PREP: NONE SEEN

## 2014-09-24 LAB — PREGNANCY, URINE: Preg Test, Ur: NEGATIVE

## 2014-09-24 LAB — URINALYSIS, ROUTINE W REFLEX MICROSCOPIC
Bilirubin Urine: NEGATIVE
GLUCOSE, UA: NEGATIVE mg/dL
Ketones, ur: NEGATIVE mg/dL
LEUKOCYTES UA: NEGATIVE
NITRITE: NEGATIVE
PH: 7 (ref 5.0–8.0)
Protein, ur: 30 mg/dL — AB
SPECIFIC GRAVITY, URINE: 1.015 (ref 1.005–1.030)
UROBILINOGEN UA: 0.2 mg/dL (ref 0.0–1.0)

## 2014-09-24 LAB — BASIC METABOLIC PANEL
Anion gap: 6 (ref 5–15)
BUN: 12 mg/dL (ref 6–23)
CO2: 24 mmol/L (ref 19–32)
CREATININE: 0.57 mg/dL (ref 0.50–1.10)
Calcium: 8.9 mg/dL (ref 8.4–10.5)
Chloride: 107 mmol/L (ref 96–112)
Glucose, Bld: 92 mg/dL (ref 70–99)
Potassium: 3.9 mmol/L (ref 3.5–5.1)
Sodium: 137 mmol/L (ref 135–145)

## 2014-09-24 LAB — ABO/RH: ABO/RH(D): O POS

## 2014-09-24 LAB — HCG, QUANTITATIVE, PREGNANCY

## 2014-09-24 LAB — URINE MICROSCOPIC-ADD ON

## 2014-09-24 MED ORDER — OXYCODONE-ACETAMINOPHEN 5-325 MG PO TABS
1.0000 | ORAL_TABLET | Freq: Once | ORAL | Status: AC
  Administered 2014-09-24: 1 via ORAL
  Filled 2014-09-24: qty 1

## 2014-09-24 NOTE — ED Notes (Signed)
Patient states that she took a pregnancy test on the 16th and it was positive. States that she was two weeks late on her period, and that she had a baby in January. States that she has been having back pain, abdominal pain and cramping.

## 2014-09-24 NOTE — ED Provider Notes (Signed)
CSN: [id number]     Arrival date & time 09/24/14  2115 History  This chart was scribed for Glynn OctaveStephen Zonnie Landen, MD by Roxy Cedarhandni Bhalodia, ED Scribe. This patient was seen in room [facility] and the patient's care was started at 9:37 PM.   Chief Complaint  Patient presents with  . Vaginal Bleeding  . Possible Pregnancy   Patient is a 19 y.o. female presenting with vaginal bleeding and pregnancy problem. The history is provided by the patient. No language interpreter was used.  Vaginal Bleeding Possible Pregnancy   HPI Comments: Carmen Martin is a 19 y.o. female with a PMHx of ADHD, headache and anxity, who presents to the Emergency Department complaining of sudden vaginal bleeding that began 1 hour ago. She also reports associated suprapubic pain that began 1 hour ago. She states that the pain feels like cramping, contractions and labor pain. She states that she gave birth to her daughter in January. Her LNMP was on February 6th. She states that she was late on this month's menstrual cycle and decided to have an at home pregnancy test. She had a positive pregnancy test on March 16th. Patient states that she takes Alprazolam and Adderol for ADHD but has discontinued taking medications since positive pregnancy test. Patient states she has a hx of 1 stillbirth in the past. She states that abdominal pain is exacerbated by movement and bending over. She denies associated emesis. This is patient's third pregnancy. Patient states she has type O positive blood.   Past Medical History  Diagnosis Date  . Anxiety   . Headache(784.0)   . Depression   . ADD (attention deficit disorder)   . Hx MRSA infection     in buttocks  . ADHD (attention deficit hyperactivity disorder) 11/02/2012   Past Surgical History  Procedure Laterality Date  . No past surgeries    . Wisdom tooth extraction  2014  . Colonoscopy with propofol N/A 09/29/2012    Procedure: COLONOSCOPY WITH PROPOFOL;  Surgeon: Malissa HippoNajeeb U Rehman, MD;   Location: [facility];  Service: Endoscopy;  Laterality: N/A;  at cecum at 0902 total withdrawal time 8minutes  . Cesarean section N/A 07/08/2014    Procedure: CESAREAN SECTION;  Surgeon: Tilda BurrowJohn Ferguson V, MD;  Location: [facility];  Service: Obstetrics;  Laterality: N/A;   Family History  Problem Relation Age of Onset  . Diabetes Mother   . Heart disease Mother   . Stroke Mother   . Hypertension Mother   . Depression Father   . Cancer Maternal Grandmother     breast   History  Substance Use Topics  . Smoking status: Current Some Day Smoker -- 0.50 packs/day for 4 years    Types: Cigarettes    Last Attempt to Quit: 07/20/2011  . Smokeless tobacco: Never Used     Comment: pt states less than 1/2 pack cig per week  . Alcohol Use: No   OB History    Gravida Para Term Preterm AB TAB SAB Ectopic Multiple Living   3 2 0 2 1 0 1 0 0 1      Review of Systems  Genitourinary: Positive for vaginal bleeding.   A complete 10 system review of systems was obtained and all systems are negative except as noted in the HPI and PMH.   Allergies  Biaxin and Vicodin  Home Medications   Prior to Admission medications   Medication Sig Start Date End Date Taking? Authorizing Provider  acetaminophen (TYLENOL) 500 MG tablet Take 500  mg by mouth every 6 (six) hours as needed for headache.   Yes Historical Provider, MD  alprazolam Prudy Feeler) 2 MG tablet Take 2 mg by mouth 2 (two) times daily.    Yes Marcellina Millin, MD  amphetamine-dextroamphetamine (ADDERALL XR) 30 MG 24 hr capsule Take 30 mg by mouth 2 (two) times daily.    Yes Marcellina Millin, MD  ibuprofen (ADVIL,MOTRIN) 600 MG tablet Take 1 tablet (600 mg total) by mouth every 6 (six) hours as needed. Patient not taking: Reported on 09/24/2014 09/04/14   Burgess Amor, PA-C  oxyCODONE-acetaminophen (PERCOCET/ROXICET) 5-325 MG per tablet Take 1 tablet by mouth every 4 (four) hours as needed (for pain scale less than 7). Patient not taking: Reported on  09/24/2014 07/10/14   Montez Morita, CNM  Prenatal Multivit-Min-Fe-FA (PRENATAL VITAMINS) 0.8 MG tablet Take 1 tablet by mouth daily. Patient not taking: Reported on 07/12/2014 03/21/14   Jacklyn Shell, CNM  sulfamethoxazole-trimethoprim (SEPTRA DS) 800-160 MG per tablet Take 1 tablet by mouth every 12 (twelve) hours. Patient not taking: Reported on 09/24/2014 09/04/14   Burgess Amor, PA-C   Triage Vitals: BP 122/73 mmHg  Pulse 77  Temp(Src) 97.4 F (36.3 C) (Oral)  Resp 24  Ht  (1.575 m)  Wt 142 lb 11.2 oz (64.728 kg)  BMI 26.09 kg/m2  SpO2 100%  LMP 08/12/2014  Physical Exam  Constitutional: She is oriented to person, place, and time. She appears well-developed and well-nourished. No distress.  Anxious and tearful.  HENT:  Head: Normocephalic and atraumatic.  Mouth/Throat: Oropharynx is clear and moist. No oropharyngeal exudate.  Eyes: Conjunctivae and EOM are normal. Pupils are equal, round, and reactive to light.  Neck: Normal range of motion. Neck supple.  No meningismus.  Cardiovascular: Normal rate, regular rhythm, normal heart sounds and intact distal pulses.   No murmur heard. Pulmonary/Chest: Effort normal and breath sounds normal. No respiratory distress.  Abdominal: Soft. There is tenderness. There is no rebound and no guarding.  Diffuse lower abdominal tenderness     Genitourinary:  Chaperone present. Normal external genitalia. Blood in vaginal vault. Cervix closed. No CMT or adnexal tenderness  Musculoskeletal: Normal range of motion. She exhibits tenderness. She exhibits no edema.  Paraspinal lumbar tenderness.  Neurological: She is alert and oriented to person, place, and time. No cranial nerve deficit. She exhibits normal muscle tone. Coordination normal.  No ataxia on finger to nose bilaterally. No pronator drift. 5/5 strength throughout. CN 2-12 intact. Negative Romberg. Equal grip strength. Sensation intact. Gait is normal.   Skin: Skin is warm.   Psychiatric: She has a normal mood and affect. Her behavior is normal.  Nursing note and vitals reviewed.  ED Course  Procedures (including critical care time)  DIAGNOSTIC STUDIES: Oxygen Saturation is 100% on RA, normal by my interpretation.    COORDINATION OF CARE: 9:40 PM- Discussed plans to order diagnostic lab work and urinalysis. Pt advised of plan for treatment and pt agrees.  Labs Review Labs Reviewed  WET PREP, GENITAL - Abnormal; Notable for the following:    WBC, Wet Prep HPF POC FEW (*)    All other components within normal limits  URINALYSIS, ROUTINE W REFLEX MICROSCOPIC - Abnormal; Notable for the following:    Color, Urine RED (*)    APPearance CLOUDY (*)    Hgb urine dipstick LARGE (*)    Protein, ur 30 (*)    All other components within normal limits  CBC WITH DIFFERENTIAL/PLATELET - Abnormal;  Notable for the following:    Hemoglobin 11.7 (*)    All other components within normal limits  URINE MICROSCOPIC-ADD ON - Abnormal; Notable for the following:    Squamous Epithelial / LPF FEW (*)    Bacteria, UA FEW (*)    All other components within normal limits  PREGNANCY, URINE  BASIC METABOLIC PANEL  HCG, QUANTITATIVE, PREGNANCY  ABO/RH  GC/CHLAMYDIA PROBE AMP ([place])   Imaging Review No results found.   EKG Interpretation None     MDM   Final diagnoses:  Vaginal bleeding   patient reports lower abdominal cramping and vaginal bleeding that onset 1 hour ago. States positive pregnancy test on the 16th. Postpartum 3 months. Feels like menstrual cramps and has had vaginal bleeding that is constant. No dizziness or lightheadedness.  Pregnancy test negative. Hemoglobin stable. No tachycardia. No hypotension. Vital stable.  Patient appears stable for follow-up with gynecology. No indication for emergent pelvic ultrasound today. No significant vaginal bleeding. Continue NSAIDs for pain control.   BP 122/73 mmHg  Pulse 77  Temp(Src) 97.4 F (36.3  C) (Oral)  Resp 24  Ht  (1.575 m)  Wt 142 lb 11.2 oz (64.728 kg)  BMI 26.09 kg/m2  SpO2 100%  LMP 08/12/2014   I personally performed the services described in this documentation, which was scribed in my presence. The recorded information has been reviewed and is accurate.   Glynn Octave, MD 09/24/14 [phone]

## 2014-09-24 NOTE — Discharge Instructions (Signed)
Menorrhagia You are not pregnant. Your blood counts are stable. Follow up with The gynecologist. Return to the ED if you develop new or worsening symptoms. Menorrhagia is the medical term for when your menstrual periods are heavy or last longer than usual. With menorrhagia, every period you have may cause enough blood loss and cramping that you are unable to maintain your usual activities. CAUSES  In some cases, the cause of heavy periods is unknown, but a number of conditions may cause menorrhagia. Common causes include:  A problem with the hormone-producing thyroid gland (hypothyroid).  Noncancerous growths in the uterus (polyps or fibroids).  An imbalance of the estrogen and progesterone hormones.  One of your ovaries not releasing an egg during one or more months.  Side effects of having an intrauterine device (IUD).  Side effects of some medicines, such as anti-inflammatory medicines or blood thinners.  A bleeding disorder that stops your blood from clotting normally. SIGNS AND SYMPTOMS  During a normal period, bleeding lasts between 4 and 8 days. Signs that your periods are too heavy include:  You routinely have to change your pad or tampon every 1 or 2 hours because it is completely soaked.  You pass blood clots larger than 1 inch (2.5 cm) in size.  You have bleeding for more than 7 days.  You need to use pads and tampons at the same time because of heavy bleeding.  You need to wake up to change your pads or tampons during the night.  You have symptoms of anemia, such as tiredness, fatigue, or shortness of breath. DIAGNOSIS  Your health care provider will perform a physical exam and ask you questions about your symptoms and menstrual history. Other tests may be ordered based on what the health care provider finds during the exam. These tests can include:  Blood tests. Blood tests are used to check if you are pregnant or have hormonal changes, a bleeding or thyroid  disorder, low iron levels (anemia), or other problems.  Endometrial biopsy. Your health care provider takes a sample of tissue from the inside of your uterus to be examined under a microscope.  Pelvic ultrasound. This test uses sound waves to make a picture of your uterus, ovaries, and vagina. The pictures can show if you have fibroids or other growths.  Hysteroscopy. For this test, your health care provider will use a small telescope to look inside your uterus. Based on the results of your initial tests, your health care provider may recommend further testing. TREATMENT  Treatment may not be needed. If it is needed, your health care provider may recommend treatment with one or more medicines first. If these do not reduce bleeding enough, a surgical treatment might be an option. The best treatment for you will depend on:   Whether you need to prevent pregnancy.  Your desire to have children in the future.  The cause and severity of your bleeding.  Your opinion and personal preference.  Medicines for menorrhagia may include:  Birth control methods that use hormones. These include the pill, skin patch, vaginal ring, shots that you get every 3 months, hormonal IUD, and implant. These treatments reduce bleeding during your menstrual period.  Medicines that thicken blood and slow bleeding.  Medicines that reduce swelling, such as ibuprofen.  Medicines that contain a synthetic hormone called progestin.   Medicines that make the ovaries stop working for a short time.  You may need surgical treatment for menorrhagia if the medicines are unsuccessful. Treatment  options include:  Dilation and curettage (D&C). In this procedure, your health care provider opens (dilates) your cervix and then scrapes or suctions tissue from the lining of your uterus to reduce menstrual bleeding.  Operative hysteroscopy. This procedure uses a tiny tube with a light (hysteroscope) to view your uterine cavity  and can help in the surgical removal of a polyp that may be causing heavy periods.  Endometrial ablation. Through various techniques, your health care provider permanently destroys the entire lining of your uterus (endometrium). After endometrial ablation, most women have little or no menstrual flow. Endometrial ablation reduces your ability to become pregnant.  Endometrial resection. This surgical procedure uses an electrosurgical wire loop to remove the lining of the uterus. This procedure also reduces your ability to become pregnant.  Hysterectomy. Surgical removal of the uterus and cervix is a permanent procedure that stops menstrual periods. Pregnancy is not possible after a hysterectomy. This procedure requires anesthesia and hospitalization. HOME CARE INSTRUCTIONS   Only take over-the-counter or prescription medicines as directed by your health care provider. Take prescribed medicines exactly as directed. Do not change or switch medicines without consulting your health care provider.  Take any prescribed iron pills exactly as directed by your health care provider. Long-term heavy bleeding may result in low iron levels. Iron pills help replace the iron your body lost from heavy bleeding. Iron may cause constipation. If this becomes a problem, increase the bran, fruits, and roughage in your diet.  Do not take aspirin or medicines that contain aspirin 1 week before or during your menstrual period. Aspirin may make the bleeding worse.  If you need to change your sanitary pad or tampon more than once every 2 hours, stay in bed and rest as much as possible until the bleeding stops.  Eat well-balanced meals. Eat foods high in iron. Examples are leafy green vegetables, meat, liver, eggs, and whole grain breads and cereals. Do not try to lose weight until the abnormal bleeding has stopped and your blood iron level is back to normal. SEEK MEDICAL CARE IF:   You soak through a pad or tampon every 1  or 2 hours, and this happens every time you have a period.  You need to use pads and tampons at the same time because you are bleeding so much.  You need to change your pad or tampon during the night.  You have a period that lasts for more than 8 days.  You pass clots bigger than 1 inch wide.  You have irregular periods that happen more or less often than once a month.  You feel dizzy or faint.  You feel very weak or tired.  You feel short of breath or feel your heart is beating too fast when you exercise.  You have nausea and vomiting or diarrhea while you are taking your medicine.  You have any problems that may be related to the medicine you are taking. SEEK IMMEDIATE MEDICAL CARE IF:   You soak through 4 or more pads or tampons in 2 hours.  You have any bleeding while you are pregnant. MAKE SURE YOU:   Understand these instructions.  Will watch your condition.  Will get help right away if you are not doing well or get worse. Document Released: 06/23/2005 Document Revised: 06/28/2013 Document Reviewed: 12/12/2012 [facility] Patient Information 2015 [place] [place]. This information is not intended to replace advice given to you by your health care provider. Make sure you discuss any questions you have with  your health care provider. ° °

## 2014-09-26 LAB — GC/CHLAMYDIA PROBE AMP (~~LOC~~) NOT AT ARMC
Chlamydia: NEGATIVE
Neisseria Gonorrhea: NEGATIVE

## 2014-10-25 ENCOUNTER — Encounter (HOSPITAL_COMMUNITY): Payer: Self-pay | Admitting: Emergency Medicine

## 2014-10-25 ENCOUNTER — Emergency Department (HOSPITAL_COMMUNITY)
Admission: EM | Admit: 2014-10-25 | Discharge: 2014-10-25 | Discharge status: Against Medical Advice | Payer: Medicaid Other | Attending: Emergency Medicine | Admitting: Emergency Medicine

## 2014-10-25 DIAGNOSIS — Z720 Tobacco use: Secondary | ICD-10-CM | POA: Insufficient documentation

## 2014-10-25 DIAGNOSIS — L309 Dermatitis, unspecified: Secondary | ICD-10-CM | POA: Diagnosis not present

## 2014-10-25 HISTORY — DX: Dermatitis, unspecified: L30.9

## 2014-10-25 NOTE — ED Notes (Signed)
Unable to locate patient.  Per registration clerk, pt was noted to leave the department with family

## 2014-10-25 NOTE — ED Notes (Signed)
Patient was seen here 6 months ago and given a cream and was told to come back if she couldn't find a doctor to get refill.

## 2014-12-12 ENCOUNTER — Ambulatory Visit: Payer: Medicaid Other | Admitting: Women's Health

## 2014-12-19 ENCOUNTER — Encounter: Payer: Self-pay | Admitting: *Deleted

## 2014-12-19 ENCOUNTER — Ambulatory Visit: Payer: Medicaid Other | Admitting: Advanced Practice Midwife

## 2015-06-08 ENCOUNTER — Ambulatory Visit: Payer: Self-pay | Admitting: Family Medicine

## 2015-08-02 ENCOUNTER — Emergency Department
Admission: EM | Admit: 2015-08-02 | Discharge: 2015-08-03 | Disposition: A | Discharge status: Home / Self Care | Payer: Medicaid Other | Attending: Emergency Medicine | Admitting: Emergency Medicine

## 2015-08-02 ENCOUNTER — Encounter: Payer: Self-pay | Admitting: Urgent Care

## 2015-08-02 DIAGNOSIS — R531 Weakness: Secondary | ICD-10-CM | POA: Diagnosis not present

## 2015-08-02 DIAGNOSIS — N939 Abnormal uterine and vaginal bleeding, unspecified: Secondary | ICD-10-CM | POA: Insufficient documentation

## 2015-08-02 DIAGNOSIS — Z3202 Encounter for pregnancy test, result negative: Secondary | ICD-10-CM | POA: Diagnosis not present

## 2015-08-02 DIAGNOSIS — F17210 Nicotine dependence, cigarettes, uncomplicated: Secondary | ICD-10-CM | POA: Insufficient documentation

## 2015-08-02 DIAGNOSIS — R110 Nausea: Secondary | ICD-10-CM | POA: Diagnosis not present

## 2015-08-02 LAB — URINALYSIS COMPLETE WITH MICROSCOPIC (ARMC ONLY)
BACTERIA UA: NONE SEEN
Glucose, UA: NEGATIVE mg/dL
Leukocytes, UA: NEGATIVE
Nitrite: NEGATIVE
PROTEIN: 30 mg/dL — AB
Specific Gravity, Urine: 1.041 — ABNORMAL HIGH (ref 1.005–1.030)
pH: 5 (ref 5.0–8.0)

## 2015-08-02 LAB — POCT PREGNANCY, URINE: PREG TEST UR: NEGATIVE

## 2015-08-02 LAB — CBC
HEMATOCRIT: 38 % (ref 35.0–47.0)
HEMOGLOBIN: 12.4 g/dL (ref 12.0–16.0)
MCH: 29 pg (ref 26.0–34.0)
MCHC: 32.6 g/dL (ref 32.0–36.0)
MCV: 88.9 fL (ref 80.0–100.0)
Platelets: 222 10*3/uL (ref 150–440)
RBC: 4.27 MIL/uL (ref 3.80–5.20)
RDW: 14 % (ref 11.5–14.5)
WBC: 8.2 10*3/uL (ref 3.6–11.0)

## 2015-08-02 LAB — BASIC METABOLIC PANEL
Anion gap: 8 (ref 5–15)
BUN: 13 mg/dL (ref 6–20)
CHLORIDE: 105 mmol/L (ref 101–111)
CO2: 26 mmol/L (ref 22–32)
CREATININE: 0.79 mg/dL (ref 0.44–1.00)
Calcium: 9.3 mg/dL (ref 8.9–10.3)
GFR calc Af Amer: >60 mL/min (ref >60)
GFR calc non Af Amer: >60 mL/min (ref >60)
Glucose, Bld: 85 mg/dL (ref 65–99)
Potassium: 3.6 mmol/L (ref 3.5–5.1)
SODIUM: 139 mmol/L (ref 135–145)

## 2015-08-02 NOTE — ED Notes (Signed)
Patient presents with c/o vaginal bleeding for "months". Patient under the care of Dr. Vergie Living at [facility] - put on Norethidrone and bleeding stopped after 3-4 days; started again yesterday. Patient with (+) weakness and N/V reported. Denies urinary symptoms.

## 2015-11-17 ENCOUNTER — Emergency Department (HOSPITAL_COMMUNITY)
Admission: EM | Admit: 2015-11-17 | Discharge: 2015-11-17 | Disposition: A | Discharge status: Home / Self Care | Payer: Medicaid Other | Attending: Emergency Medicine | Admitting: Emergency Medicine

## 2015-11-17 ENCOUNTER — Encounter (HOSPITAL_COMMUNITY): Payer: Self-pay | Admitting: *Deleted

## 2015-11-17 DIAGNOSIS — T7840XA Allergy, unspecified, initial encounter: Secondary | ICD-10-CM | POA: Diagnosis not present

## 2015-11-17 DIAGNOSIS — F17210 Nicotine dependence, cigarettes, uncomplicated: Secondary | ICD-10-CM | POA: Insufficient documentation

## 2015-11-17 DIAGNOSIS — R0682 Tachypnea, not elsewhere classified: Secondary | ICD-10-CM | POA: Insufficient documentation

## 2015-11-17 DIAGNOSIS — R21 Rash and other nonspecific skin eruption: Secondary | ICD-10-CM | POA: Diagnosis present

## 2015-11-17 DIAGNOSIS — F909 Attention-deficit hyperactivity disorder, unspecified type: Secondary | ICD-10-CM | POA: Insufficient documentation

## 2015-11-17 DIAGNOSIS — F329 Major depressive disorder, single episode, unspecified: Secondary | ICD-10-CM | POA: Diagnosis not present

## 2015-11-17 MED ORDER — PREDNISONE 50 MG PO TABS: ORAL_TABLET | ORAL | Status: DC

## 2015-11-17 MED ORDER — DIPHENHYDRAMINE HCL 50 MG/ML IJ SOLN
25.0000 mg | Freq: Once | INTRAMUSCULAR | Status: AC
  Administered 2015-11-17: 25 mg via INTRAVENOUS
  Filled 2015-11-17: qty 1

## 2015-11-17 MED ORDER — METHYLPREDNISOLONE SODIUM SUCC 125 MG IJ SOLR
125.0000 mg | Freq: Once | INTRAMUSCULAR | Status: AC
  Administered 2015-11-17: 125 mg via INTRAVENOUS
  Filled 2015-11-17: qty 2

## 2015-11-17 MED ORDER — TRIAMCINOLONE ACETONIDE 0.1 % EX CREA: 1.0000 "application " | TOPICAL_CREAM | Freq: Two times a day (BID) | CUTANEOUS | Status: DC

## 2015-11-17 MED ORDER — SODIUM CHLORIDE 0.9 % IV BOLUS (SEPSIS)
1000.0000 mL | Freq: Once | INTRAVENOUS | Status: AC
  Administered 2015-11-17: 1000 mL via INTRAVENOUS

## 2015-11-17 MED ORDER — FAMOTIDINE IN NACL 20-0.9 MG/50ML-% IV SOLN
20.0000 mg | Freq: Once | INTRAVENOUS | Status: AC
  Administered 2015-11-17: 20 mg via INTRAVENOUS
  Filled 2015-11-17: qty 50

## 2015-11-17 NOTE — Discharge Instructions (Signed)
Prescription for triamcinolone cream and prednisone. Recommend follow-up with dermatologist.

## 2015-11-17 NOTE — ED Notes (Signed)
Pt states she starting breaking out all over yesterday, pt has evidence of eczema but pt states it is worse. Pt states she had hives earlier. Pt is anxious upon triage and states she cannot breath. Pt is able to make complete sentences. Pt states her "skin hurts" Pt has redness noted to bilateral eyes; pt is crying as well.

## 2015-11-17 NOTE — ED Provider Notes (Signed)
CSN: [id number]     Arrival date & time 11/17/15  1045 History  By signing my name below, I, Emmanuella Mensah, attest that this documentation has been prepared under the direction and in the presence of Donnetta HutchingBrian Laquenta Whitsell, MD. Electronically Signed: Angelene GiovanniEmmanuella Mensah, ED Scribe. 11/17/2015. 11:26 AM.    Chief Complaint  Patient presents with  . Allergic Reaction   The history is provided by the patient. No language interpreter was used.   HPI Comments:Level V caveat for urgent need for intervention Carmen Martin is a [age] y.o. female with a hx of eczema and anxiety who presents to the Emergency Department complaining of gradually worsening painful hives to the entire body onset 2 days ago. Pt is tearful as she explains that she had peanut butter Oreos 2 days ago. She reports associated trouble breathing. She presents with red bumps consistent with her eczema which pt states is her baseline. She explains that she has had similar symptoms in the past where she needed to come to the hospital. No fever, chills, or n/v.    Past Medical History  Diagnosis Date  . Anxiety   . Headache(784.0)   . Depression   . ADD (attention deficit disorder)   . Hx MRSA infection     in buttocks  . ADHD (attention deficit hyperactivity disorder) 11/02/2012  . Eczema    Past Surgical History  Procedure Laterality Date  . No past surgeries    . Wisdom tooth extraction  2014  . Colonoscopy with propofol N/A 09/29/2012    Procedure: COLONOSCOPY WITH PROPOFOL;  Surgeon: Malissa HippoNajeeb U Rehman, MD;  Location: [facility];  Service: Endoscopy;  Laterality: N/A;  at cecum at 0902 total withdrawal time 8minutes  . Cesarean section N/A 07/08/2014    Procedure: CESAREAN SECTION;  Surgeon: Tilda BurrowJohn Ferguson V, MD;  Location: [facility];  Service: Obstetrics;  Laterality: N/A;   Family History  Problem Relation Age of Onset  . Diabetes Mother   . Heart disease Mother   . Stroke Mother   . Hypertension Mother   . Depression Father   . Cancer  Maternal Grandmother     breast   Social History  Substance Use Topics  . Smoking status: Current Some Day Smoker -- 0.50 packs/day for 4 years    Types: Cigarettes  . Smokeless tobacco: Never Used     Comment: pt states less than 1/2 pack cig per week  . Alcohol Use: No   OB History    Gravida Para Term Preterm AB TAB SAB Ectopic Multiple Living   3 2 0 2 1 0 1 0 0 1      Review of Systems  Constitutional: Negative for fever and chills.  Gastrointestinal: Negative for nausea and vomiting.  Skin: Positive for rash.  Psychiatric/Behavioral: The patient is nervous/anxious.   All other systems reviewed and are negative.     Allergies  Biaxin and Vicodin  Home Medications   Prior to Admission medications   Medication Sig Start Date End Date Taking? Authorizing Provider  acetaminophen (TYLENOL) 500 MG tablet Take 500 mg by mouth every 6 (six) hours as needed for headache.    Historical Provider, MD  alprazolam Prudy Feeler(XANAX) 2 MG tablet Take 2 mg by mouth 2 (two) times daily.     Marcellina MillinKeshavpal Gunna Reddy, MD  amphetamine-dextroamphetamine (ADDERALL XR) 30 MG 24 hr capsule Take 30 mg by mouth 2 (two) times daily.     Marcellina MillinKeshavpal Gunna Reddy, MD  ibuprofen (ADVIL,MOTRIN) 600 MG  tablet Take 1 tablet (600 mg total) by mouth every 6 (six) hours as needed. Patient not taking: Reported on 09/24/2014 09/04/14   Burgess Amor, PA-C  oxyCODONE-acetaminophen (PERCOCET/ROXICET) 5-325 MG per tablet Take 1 tablet by mouth every 4 (four) hours as needed (for pain scale less than 7). Patient not taking: Reported on 09/24/2014 07/10/14   Montez Morita, CNM  predniSONE (DELTASONE) 50 MG tablet 1 tablet for 6 days, one half tablet for 6 days 11/17/15   Donnetta Hutching, MD  Prenatal Multivit-Min-Fe-FA (PRENATAL VITAMINS) 0.8 MG tablet Take 1 tablet by mouth daily. Patient not taking: Reported on 07/12/2014 03/21/14   Jacklyn Shell, CNM  sulfamethoxazole-trimethoprim (SEPTRA DS) 800-160 MG per tablet Take 1 tablet by  mouth every 12 (twelve) hours. Patient not taking: Reported on 09/24/2014 09/04/14   Burgess Amor, PA-C  triamcinolone cream (KENALOG) 0.1 % Apply 1 application topically 2 (two) times daily. 11/17/15   Donnetta Hutching, MD   BP 95/63 mmHg  Pulse 59  Temp(Src) 98 F (36.7 C) (Oral)  Resp 16  Ht 5' (1.524 m)  Wt 120 lb (54.432 kg)  BMI 23.44 kg/m2  SpO2 100%  LMP 10/22/2015 Physical Exam  Constitutional: She is oriented to person, place, and time. She appears well-developed and well-nourished.  Pt is crying  HENT:  Head: Normocephalic and atraumatic.  Oral cavity is clear  Eyes: Conjunctivae and EOM are normal. Pupils are equal, round, and reactive to light.  Neck: Normal range of motion. Neck supple.  Cardiovascular: Normal rate and regular rhythm.   Pulmonary/Chest: Effort normal and breath sounds normal. Tachypnea noted.  Abdominal: Soft. Bowel sounds are normal.  Musculoskeletal: Normal range of motion.  Neurological: She is alert and oriented to person, place, and time.  Skin:  Diffuse papular erythematous discreet lesions  Psychiatric: She has a normal mood and affect. Her behavior is normal.  Nursing note and vitals reviewed.   ED Course  Procedures (including critical care time) DIAGNOSTIC STUDIES: Oxygen Saturation is 98% on RA, normal by my interpretation.    COORDINATION OF CARE: 11:10 AM- Pt advised of plan for treatment and pt agrees. Pt will receive IV fluids, Solumedrol, Benadryl, and Pepcid.   Labs Review Labs Reviewed - No data to display  Imaging Review No results found.   Donnetta Hutching, MD has personally reviewed and evaluated these images and lab results as part of his medical decision-making.   EKG Interpretation None      MDM   Final diagnoses:  Allergic reaction, initial encounter  Rash    Patient had an allergic response to unknown allergen today. IV steroids, Benadryl, Reglan seemed to help. She has a chronic eczema for which triamcinolone cream  helps. Will refill Rx and also prednisone. Follow-up with dermatology.  I personally performed the services described in this documentation, which was scribed in my presence. The recorded information has been reviewed and is accurate.    Donnetta Hutching, MD 11/17/15 [phone]

## 2016-01-21 ENCOUNTER — Encounter (HOSPITAL_COMMUNITY): Payer: Self-pay | Admitting: *Deleted

## 2016-01-21 ENCOUNTER — Emergency Department (HOSPITAL_COMMUNITY): Payer: Medicaid Other

## 2016-01-21 ENCOUNTER — Emergency Department (HOSPITAL_COMMUNITY)
Admission: EM | Admit: 2016-01-21 | Discharge: 2016-01-21 | Discharge status: Against Medical Advice | Payer: Medicaid Other | Attending: Emergency Medicine | Admitting: Emergency Medicine

## 2016-01-21 ENCOUNTER — Ambulatory Visit (HOSPITAL_COMMUNITY): Payer: Medicaid Other

## 2016-01-21 DIAGNOSIS — M549 Dorsalgia, unspecified: Secondary | ICD-10-CM | POA: Insufficient documentation

## 2016-01-21 DIAGNOSIS — M542 Cervicalgia: Secondary | ICD-10-CM | POA: Diagnosis present

## 2016-01-21 DIAGNOSIS — F17210 Nicotine dependence, cigarettes, uncomplicated: Secondary | ICD-10-CM | POA: Diagnosis not present

## 2016-01-21 DIAGNOSIS — F329 Major depressive disorder, single episode, unspecified: Secondary | ICD-10-CM | POA: Diagnosis not present

## 2016-01-21 DIAGNOSIS — Y999 Unspecified external cause status: Secondary | ICD-10-CM | POA: Insufficient documentation

## 2016-01-21 DIAGNOSIS — Y9389 Activity, other specified: Secondary | ICD-10-CM | POA: Insufficient documentation

## 2016-01-21 DIAGNOSIS — S134XXA Sprain of ligaments of cervical spine, initial encounter: Secondary | ICD-10-CM | POA: Insufficient documentation

## 2016-01-21 DIAGNOSIS — Y929 Unspecified place or not applicable: Secondary | ICD-10-CM | POA: Diagnosis not present

## 2016-01-21 DIAGNOSIS — Z791 Long term (current) use of non-steroidal anti-inflammatories (NSAID): Secondary | ICD-10-CM | POA: Diagnosis not present

## 2016-01-21 DIAGNOSIS — F909 Attention-deficit hyperactivity disorder, unspecified type: Secondary | ICD-10-CM | POA: Diagnosis not present

## 2016-01-21 MED ORDER — ACETAMINOPHEN 500 MG PO TABS: 1000.0000 mg | ORAL_TABLET | Freq: Once | ORAL | Status: DC

## 2016-01-21 NOTE — ED Notes (Signed)
[employer] called as requested by EDP.
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
## 2016-01-21 NOTE — ED Notes (Signed)
Patient walking toward exit. Attempted to get patient to come back to room. Patient ignored this nurse and continued walking out the door. Notified by registration that patient states she is walking home.
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
## 2016-03-30 ENCOUNTER — Emergency Department (HOSPITAL_COMMUNITY): Payer: Medicaid Other

## 2016-03-30 ENCOUNTER — Emergency Department (HOSPITAL_COMMUNITY)
Admission: EM | Admit: 2016-03-30 | Discharge: 2016-03-30 | Discharge status: Against Medical Advice | Payer: Medicaid Other | Attending: Emergency Medicine | Admitting: Emergency Medicine

## 2016-03-30 ENCOUNTER — Encounter (HOSPITAL_COMMUNITY): Payer: Self-pay | Admitting: Adult Health

## 2016-03-30 DIAGNOSIS — O26899 Other specified pregnancy related conditions, unspecified trimester: Secondary | ICD-10-CM

## 2016-03-30 DIAGNOSIS — Z3A11 11 weeks gestation of pregnancy: Secondary | ICD-10-CM | POA: Insufficient documentation

## 2016-03-30 DIAGNOSIS — O209 Hemorrhage in early pregnancy, unspecified: Secondary | ICD-10-CM | POA: Diagnosis not present

## 2016-03-30 DIAGNOSIS — F909 Attention-deficit hyperactivity disorder, unspecified type: Secondary | ICD-10-CM | POA: Diagnosis not present

## 2016-03-30 DIAGNOSIS — O99331 Smoking (tobacco) complicating pregnancy, first trimester: Secondary | ICD-10-CM | POA: Diagnosis not present

## 2016-03-30 DIAGNOSIS — R102 Pelvic and perineal pain: Secondary | ICD-10-CM | POA: Diagnosis not present

## 2016-03-30 DIAGNOSIS — F17210 Nicotine dependence, cigarettes, uncomplicated: Secondary | ICD-10-CM | POA: Diagnosis not present

## 2016-03-30 DIAGNOSIS — O26891 Other specified pregnancy related conditions, first trimester: Secondary | ICD-10-CM | POA: Diagnosis not present

## 2016-03-30 DIAGNOSIS — N939 Abnormal uterine and vaginal bleeding, unspecified: Secondary | ICD-10-CM

## 2016-03-30 DIAGNOSIS — Z79899 Other long term (current) drug therapy: Secondary | ICD-10-CM | POA: Diagnosis not present

## 2016-03-30 HISTORY — DX: Other psychoactive substance abuse, uncomplicated: F19.10

## 2016-03-30 HISTORY — DX: Opioid use, unspecified, uncomplicated: F11.90

## 2016-03-30 LAB — BASIC METABOLIC PANEL
Anion gap: 7 (ref 5–15)
BUN: 7 mg/dL (ref 6–20)
CALCIUM: 8.7 mg/dL — AB (ref 8.9–10.3)
CO2: 20 mmol/L — ABNORMAL LOW (ref 22–32)
Chloride: 107 mmol/L (ref 101–111)
Creatinine, Ser: 0.45 mg/dL (ref 0.44–1.00)
GFR calc Af Amer: >60 mL/min (ref >60)
GLUCOSE: 99 mg/dL (ref 65–99)
POTASSIUM: 3.3 mmol/L — AB (ref 3.5–5.1)
SODIUM: 134 mmol/L — AB (ref 135–145)

## 2016-03-30 LAB — CBC WITH DIFFERENTIAL/PLATELET
BASOS ABS: 0 10*3/uL (ref 0.0–0.1)
BASOS PCT: 0 %
EOS ABS: 0.3 10*3/uL (ref 0.0–0.7)
Eosinophils Relative: 2 %
HEMATOCRIT: 32.5 % — AB (ref 36.0–46.0)
HEMOGLOBIN: 11 g/dL — AB (ref 12.0–15.0)
Lymphocytes Relative: 16 %
Lymphs Abs: 2.1 10*3/uL (ref 0.7–4.0)
MCH: 29.7 pg (ref 26.0–34.0)
MCHC: 33.8 g/dL (ref 30.0–36.0)
MCV: 87.8 fL (ref 78.0–100.0)
Monocytes Absolute: 0.8 10*3/uL (ref 0.1–1.0)
Monocytes Relative: 6 %
NEUTROS ABS: 9.9 10*3/uL — AB (ref 1.7–7.7)
NEUTROS PCT: 76 %
Platelets: 255 10*3/uL (ref 150–400)
RBC: 3.7 MIL/uL — AB (ref 3.87–5.11)
RDW: 13.4 % (ref 11.5–15.5)
WBC: 13.1 10*3/uL — AB (ref 4.0–10.5)

## 2016-03-30 LAB — ABO/RH: ABO/RH(D): O POS

## 2016-03-30 LAB — HCG, QUANTITATIVE, PREGNANCY: HCG, BETA CHAIN, QUANT, S: 80049 m[IU]/mL — AB (ref <5)

## 2016-03-30 NOTE — ED Notes (Signed)
Lab at bedside for additional blood work.

## 2016-03-30 NOTE — Discharge Instructions (Signed)
Your ultrasound today showed a single intrauterine pregnancy appoximately 11 weeks and 230 days old.  Avoid strenuous activity and do NOT place anything into your vagina, ie: no douching, no tampons, no sexual intercourse, no swimming or tub baths, until you are seen in follow up by your regular OB/GYN doctor.  Call your regular OB/GYN doctor tomorrow morning to schedule a follow up appointment within the next 1 to 2 days.  Return to the Emergency Department immediately if worsening.

## 2016-03-30 NOTE — ED Notes (Signed)
Pt and family updated on plan of care, pt resting with eyes closed when RN entered room,

## 2016-03-30 NOTE — ED Provider Notes (Signed)
[facility] Provider Note   CSN: [id number] Arrival date & time: 03/30/16  1729     History   Chief Complaint Chief Complaint  Patient presents with  . Vaginal Bleeding    HPI Carmen Martin is a 20 y.o. female.   Vaginal Bleeding  Primary symptoms include vaginal bleeding.  Pt was seen at 1815. Per pt, c/o gradual onset and persistence of constant vaginal bleeding that began 1 hour PTA. Has been associated with pelvic pain. Pt states she "knows I'm having a miscarriage." Pt has not taken a pregnancy test but believes she is "[redacted] weeks pregnant." States her LMP was 02/05/16. Hx G3, 1 living, 1 SAB, 1 stillbirth. Denies back pain, no N/V/D, no CP/SOB, no fevers.    Past Medical History:  Diagnosis Date  . ADD (attention deficit disorder)   . ADHD (attention deficit hyperactivity disorder) 11/02/2012  . Anxiety   . Depression   . Eczema   . Headache(784.0)   . Hx MRSA infection    in buttocks  . Narcotic drug use 2015  . Substance abuse     Patient Active Problem List   Diagnosis Date Noted  . Antepartum placental abruption in third trimester, chronic 07/08/2014  . IUGR (intrauterine growth retardation), delivered, current hospitalization 07/08/2014  . Status post cesarean section 07/08/2014  . Fetal heart deceleration   . IUGR (intrauterine growth restriction) affecting care of mother   . [redacted] weeks gestation of pregnancy   . Narcotic abuse 07/06/2014  . AFI (amniotic fluid index) borderline low 05/31/2014  . IUGR (intrauterine growth restriction) 05/31/2014  . Long term prescription benzodiazepine use 05/18/2014  . Supervision of other high-risk pregnancy 2014-03-26  . Late prenatal care in second trimester 03/26/14  . History of intrauterine fetal death, currently pregnant 2014/03/26  . ADHD (attention deficit hyperactivity disorder) 11/02/2012  . Anxiety state, unspecified 09/22/2012  . Depression 09/22/2012  . Rectal bleeding 09/22/2012  . Nausea  09/22/2012  . Abdominal pain, epigastric 09/22/2012    Past Surgical History:  Procedure Laterality Date  . CESAREAN SECTION N/A 07/08/2014   Procedure: CESAREAN SECTION;  Surgeon: Tilda Burrow, MD;  Location: [facility];  Service: Obstetrics;  Laterality: N/A;  . COLONOSCOPY WITH PROPOFOL N/A 09/29/2012   Procedure: COLONOSCOPY WITH PROPOFOL;  Surgeon: Malissa Hippo, MD;  Location: [facility];  Service: Endoscopy;  Laterality: N/A;  at cecum at 0902 total withdrawal time  . WISDOM TOOTH EXTRACTION  2014    OB History    Gravida Para Term Preterm AB Living   3 2 0 2 1 1    SAB TAB Ectopic Multiple Live Births   1 0 0 0 1       Home Medications    Prior to Admission medications   Medication Sig Start Date End Date Taking? Authorizing Provider  acetaminophen (TYLENOL) 500 MG tablet Take 500 mg by mouth every 6 (six) hours as needed for headache.    Historical Provider, MD  alprazolam Prudy Feeler) 2 MG tablet Take 2 mg by mouth 2 (two) times daily.     Marcellina Millin, MD  amphetamine-dextroamphetamine (ADDERALL XR) 30 MG 24 hr capsule Take 30 mg by mouth 2 (two) times daily.     Marcellina Millin, MD  ibuprofen (ADVIL,MOTRIN) 600 MG tablet Take 1 tablet (600 mg total) by mouth every 6 (six) hours as needed. Patient not taking: Reported on 09/24/2014 09/04/14   Burgess Amor, PA-C  oxyCODONE-acetaminophen (PERCOCET/ROXICET) 5-325 MG per  tablet Take 1 tablet by mouth every 4 (four) hours as needed (for pain scale less than 7). Patient not taking: Reported on 09/24/2014 07/10/14   Montez MoritaMarie D Lawson, CNM  predniSONE (DELTASONE) 50 MG tablet 1 tablet for 6 days, one half tablet for 6 days 11/17/15   Donnetta HutchingBrian Cook, MD  Prenatal Multivit-Min-Fe-FA (PRENATAL VITAMINS) 0.8 MG tablet Take 1 tablet by mouth daily. Patient not taking: Reported on 07/12/2014 03/21/14   Jacklyn ShellFrances Cresenzo-Dishmon, CNM  sulfamethoxazole-trimethoprim (SEPTRA DS) 800-160 MG per tablet Take 1 tablet by mouth every 12 (twelve)  hours. Patient not taking: Reported on 09/24/2014 09/04/14   Burgess AmorJulie Idol, PA-C  triamcinolone cream (KENALOG) 0.1 % Apply 1 application topically 2 (two) times daily. 11/17/15   Donnetta HutchingBrian Cook, MD    Family History Family History  Problem Relation Age of Onset  . Diabetes Mother   . Heart disease Mother   . Stroke Mother   . Hypertension Mother   . Depression Father   . Cancer Maternal Grandmother     breast    Social History Social History  Substance Use Topics  . Smoking status: Current Some Day Smoker    Packs/day: 0.50    Years: 4.00    Types: Cigarettes  . Smokeless tobacco: Never Used     Comment: pt states less than 1/2 pack cig per week  . Alcohol use No     Allergies   Biaxin [clarithromycin] and Vicodin [hydrocodone-acetaminophen]   Review of Systems Review of Systems  Genitourinary: Positive for vaginal bleeding.  ROS: Statement: All systems negative except as marked or noted in the HPI; Constitutional: Negative for fever and chills. ; ; Eyes: Negative for eye pain, redness and discharge. ; ; ENMT: Negative for ear pain, hoarseness, nasal congestion, sinus pressure and sore throat. ; ; Cardiovascular: Negative for chest pain, palpitations, diaphoresis, dyspnea and peripheral edema. ; ; Respiratory: Negative for cough, wheezing and stridor. ; ; Gastrointestinal: Negative for nausea, vomiting, diarrhea, abdominal pain, blood in stool, hematemesis, jaundice and rectal bleeding. . ; ; Genitourinary: Negative for dysuria, flank pain and hematuria. ; ; GYN:  +pelvic pain, +vaginal bleeding, no vaginal discharge, no vulvar pain. ;; Musculoskeletal: Negative for back pain and neck pain. Negative for swelling and trauma.; ; Skin: Negative for pruritus, rash, abrasions, blisters, bruising and skin lesion.; ; Neuro: Negative for headache, lightheadedness and neck stiffness. Negative for weakness, altered level of consciousness, altered mental status, extremity weakness, paresthesias,  involuntary movement, seizure and syncope.      Physical Exam Updated Vital Signs BP 111/67   Pulse (!) 122   Temp 98.7 F (37.1 C) (Oral)   Resp 18   LMP 02/05/2016 (Exact Date)   SpO2 97%   Breastfeeding? No   Physical Exam 1820: Physical examination:  Nursing notes reviewed; Vital signs and O2 SAT reviewed;  Constitutional: Well developed, Well nourished, Well hydrated, In no acute distress; Head:  Normocephalic, atraumatic; Eyes: EOMI, PERRL, No scleral icterus; ENMT: Mouth and pharynx normal, Mucous membranes moist; Neck: Supple, Full range of motion, No lymphadenopathy; Cardiovascular: Tachycardic rate and rhythm, No gallop; Respiratory: Breath sounds clear & equal bilaterally, No wheezes.  Speaking full sentences with ease, Normal respiratory effort/excursion; Chest: Nontender, Movement normal; Abdomen: Soft, +suprapubic tenderness to palp. No rebound or guarding. Nondistended, Normal bowel sounds; Genitourinary: No CVA tenderness; Extremities: Pulses normal, No tenderness, No edema, No calf edema or asymmetry.; Neuro: AA&Ox3, Major CN grossly intact.  Speech clear. No gross focal motor or sensory  deficits in extremities. Climbs on and off stretcher easily by herself. Gait steady.; Skin: Color normal, Warm, Dry.   ED Treatments / Results  Labs (all labs ordered are listed, but only abnormal results are displayed)   EKG  EKG Interpretation None       Radiology   Procedures Procedures (including critical care time)  Medications Ordered in ED Medications - No data to display   Initial Impression / Assessment and Plan / ED Course  I have reviewed the triage vital signs and the nursing notes.  Pertinent labs & imaging results that were available during my care of the patient were reviewed by me and considered in my medical decision making (see chart for details).  MDM Reviewed: previous chart, nursing note and vitals Reviewed previous: labs Interpretation:  labs   Results for orders placed or performed during the hospital encounter of 03/30/16  CBC with Differential  Result Value Ref Range   WBC 13.1 (H) 4.0 - 10.5 K/uL   RBC 3.70 (L) 3.87 - 5.11 MIL/uL   Hemoglobin 11.0 (L) 12.0 - 15.0 g/dL   HCT 16.1 (L) 09.6 - 04.5 %   MCV 87.8 78.0 - 100.0 fL   MCH 29.7 26.0 - 34.0 pg   MCHC 33.8 30.0 - 36.0 g/dL   RDW 40.9 81.1 - 91.4 %   Platelets 255 150 - 400 K/uL   Neutrophils Relative % 76 %   Neutro Abs 9.9 (H) 1.7 - 7.7 K/uL   Lymphocytes Relative 16 %   Lymphs Abs 2.1 0.7 - 4.0 K/uL   Monocytes Relative 6 %   Monocytes Absolute 0.8 0.1 - 1.0 K/uL   Eosinophils Relative 2 %   Eosinophils Absolute 0.3 0.0 - 0.7 K/uL   Basophils Relative 0 %   Basophils Absolute 0.0 0.0 - 0.1 K/uL  hCG, quantitative, pregnancy  Result Value Ref Range   hCG, Beta Chain, Quant, S 80,049 (H) <5 mIU/mL  Basic metabolic panel  Result Value Ref Range   Sodium 134 (L) 135 - 145 mmol/L   Potassium 3.3 (L) 3.5 - 5.1 mmol/L   Chloride 107 101 - 111 mmol/L   CO2 20 (L) 22 - 32 mmol/L   Glucose, Bld 99 65 - 99 mg/dL   BUN 7 6 - 20 mg/dL   Creatinine, Ser 7.82 0.44 - 1.00 mg/dL   Calcium 8.7 (L) 8.9 - 10.3 mg/dL   GFR calc non Af Amer >60 >60 mL/min   GFR calc Af Amer >60 >60 mL/min   Anion gap 7 5 - 15   US Ob Comp Less 14 Wks Result Date: 03/30/2016 CLINICAL DATA:  Acute onset of vaginal bleeding and pelvic pain. Initial encounter. EXAM: OBSTETRIC <14 WK [place] AND TRANSVAGINAL OB US TECHNIQUE: Both transabdominal and transvaginal ultrasound examinations were performed for complete evaluation of the gestation as well as the maternal uterus, adnexal regions, and pelvic cul-de-sac. Transvaginal technique was performed to assess early pregnancy. COMPARISON:  Pelvic ultrasound performed 07/08/2014, and CT of the abdomen and pelvis performed 08/05/2014 FINDINGS: Intrauterine gestational sac: Single; visualized and normal in shape. Yolk sac:  Yes Embryo:  Yes Cardiac  Activity: Yes Heart Rate: 168  bpm CRL:  4.1 cm   11 w   0 d                  [place] EDC: 10/19/2016 Subchorionic hemorrhage: A moderate amount of subchorionic hemorrhage is noted at the left anterior aspect of the uterus. Maternal uterus/adnexae:  The uterus is otherwise unremarkable. The ovaries are unremarkable in appearance. The right ovary measures 1.9 x 1.2 x 1.3 cm, while the left ovary measures 2.7 x 2.4 x 1.6 cm. No suspicious adnexal masses are seen; there is no evidence for ovarian torsion. No free fluid is seen within the pelvic cul-de-sac. IMPRESSION: 1. Single live intrauterine pregnancy noted, with a crown-rump length of 4.1 cm, corresponding to a gestational age of [redacted] weeks 0 days. This does not match the gestational age by LMP, and reflects a new estimated date of delivery of October 19, 2016. 2. Moderate amount of subchorionic hemorrhage noted. Electronically Signed   By: Roanna Raider M.D.   On: 03/30/2016 21:22    1950:  EGA via LMP would be 7 weeks and 5/7 days. Will check [place] r/o ectopic vs SAB.   2200:  Pt continues to request "some pain meds." Offered tylenol several times; pt refuses. Aware she will not receive narcotic pain meds. Pt is also refusing UA and pelvic exam. ED RN and I explained rationale for both. Pt refuses. Pt's friend then began to encourage pt to have testing completed; pt again refused and then stated she was leaving. ED RN, I and pt's friend encouraged pt to stay, continues to refuse.  Pt makes her own medical decisions.  Risks of AMA explained to pt and friend, including, but not limited to:  Vaginal infection, UTI, miscarriage, fetal infection, stroke, heart attack, cardiac arrythmia ("irregular heart rate/beat"), "passing out," temporary and/or permanent disability, death.  Pt and friend verb understanding and continue to refuse testing, understanding the consequences of their decision.  I encouraged pt to follow up with her OB/GYN tomorrow and return to the ED  immediately if symptoms worsen, she changes her mind, or for any other concerns.  Pt and friend verb understanding, agreeable.     Final Clinical Impressions(s) / ED Diagnoses   Final diagnoses:  None    New Prescriptions New Prescriptions   No medications on file      Samuel Jester, DO 04/01/16 1808

## 2016-03-30 NOTE — ED Notes (Signed)
In room with MD to do vaginal exam.  Pt refused vaginal exam and in and out catheter.  MD explained risk of refusing both test and pt verbalized she understands risk and still refused.

## 2016-03-30 NOTE — ED Triage Notes (Addendum)
Presents with vaginal bleeding began today associated with abdominal pain, back pain and vaginal pressure. Last period August 1st. Pt has large amount of blood on pants and is "gushing blood everytime I stand up.  Dr. Emelda FearFerguson is her OB. She has not seen him yest. One living child at home, 1 miscarriage, 1 still birth.  Pt is pale.

## 2016-03-30 NOTE — ED Notes (Signed)
Called into pt's room, pt states " I want the iv out, I am ready to go home, I will see my dr in am" Dr Clarene DukeMcmanus notified, advised to sign pt out ama, risks of leaving ama explained to pt who expressed understanding,

## 2016-03-30 NOTE — ED Notes (Signed)
Pt returned from ultrasound,

## 2016-03-30 NOTE — ED Notes (Signed)
EDP at bedside  

## 2016-03-30 NOTE — ED Notes (Signed)
Pelvic materials set up at bedside. Pt undressed.

## 2016-03-30 NOTE — ED Notes (Signed)
Pt transported to ultrasound,

## 2016-05-07 ENCOUNTER — Ambulatory Visit (INDEPENDENT_AMBULATORY_CARE_PROVIDER_SITE_OTHER): Payer: Medicaid Other

## 2016-05-07 ENCOUNTER — Other Ambulatory Visit: Payer: Self-pay | Admitting: Obstetrics and Gynecology

## 2016-05-07 DIAGNOSIS — O4690 Antepartum hemorrhage, unspecified, unspecified trimester: Secondary | ICD-10-CM

## 2016-05-07 DIAGNOSIS — Z3A16 16 weeks gestation of pregnancy: Secondary | ICD-10-CM

## 2016-05-07 DIAGNOSIS — O3680X0 Pregnancy with inconclusive fetal viability, not applicable or unspecified: Secondary | ICD-10-CM

## 2016-05-07 DIAGNOSIS — O4692 Antepartum hemorrhage, unspecified, second trimester: Secondary | ICD-10-CM

## 2016-05-07 NOTE — Progress Notes (Signed)
US 16+3 wks,measurements c/w dates,normal ov's bilat,post pl gr 0,anterior subchorionic hemorrhage 8.6 x 1.6 x 6.3 cm,svp of fluid 5 cm,fhr 148 bpm,pt has made a  appt with provider.

## 2016-05-14 ENCOUNTER — Encounter: Payer: Medicaid Other | Admitting: Advanced Practice Midwife

## 2016-05-21 ENCOUNTER — Encounter: Payer: Self-pay | Admitting: Advanced Practice Midwife

## 2016-05-21 ENCOUNTER — Ambulatory Visit (INDEPENDENT_AMBULATORY_CARE_PROVIDER_SITE_OTHER): Payer: Medicaid Other | Admitting: Advanced Practice Midwife

## 2016-05-21 ENCOUNTER — Other Ambulatory Visit (HOSPITAL_COMMUNITY)
Admission: RE | Admit: 2016-05-21 | Discharge: 2016-05-21 | Disposition: A | Discharge status: Home / Self Care | Payer: Medicaid Other | Source: Ambulatory Visit | Attending: Advanced Practice Midwife | Admitting: Advanced Practice Midwife

## 2016-05-21 VITALS — BP 112/60 | HR 90 | Ht 61.0 in | Wt 125.0 lb

## 2016-05-21 DIAGNOSIS — Z3A19 19 weeks gestation of pregnancy: Secondary | ICD-10-CM

## 2016-05-21 DIAGNOSIS — Z3482 Encounter for supervision of other normal pregnancy, second trimester: Secondary | ICD-10-CM

## 2016-05-21 DIAGNOSIS — Z113 Encounter for screening for infections with a predominantly sexual mode of transmission: Secondary | ICD-10-CM | POA: Insufficient documentation

## 2016-05-21 DIAGNOSIS — Z331 Pregnant state, incidental: Secondary | ICD-10-CM

## 2016-05-21 DIAGNOSIS — Z363 Encounter for antenatal screening for malformations: Secondary | ICD-10-CM

## 2016-05-21 DIAGNOSIS — Z79899 Other long term (current) drug therapy: Secondary | ICD-10-CM

## 2016-05-21 DIAGNOSIS — Z8759 Personal history of other complications of pregnancy, childbirth and the puerperium: Secondary | ICD-10-CM

## 2016-05-21 DIAGNOSIS — O99342 Other mental disorders complicating pregnancy, second trimester: Secondary | ICD-10-CM

## 2016-05-21 DIAGNOSIS — Z1389 Encounter for screening for other disorder: Secondary | ICD-10-CM | POA: Diagnosis not present

## 2016-05-21 DIAGNOSIS — Z124 Encounter for screening for malignant neoplasm of cervix: Secondary | ICD-10-CM

## 2016-05-21 DIAGNOSIS — O09292 Supervision of pregnancy with other poor reproductive or obstetric history, second trimester: Secondary | ICD-10-CM

## 2016-05-21 DIAGNOSIS — O0932 Supervision of pregnancy with insufficient antenatal care, second trimester: Secondary | ICD-10-CM

## 2016-05-21 DIAGNOSIS — O09299 Supervision of pregnancy with other poor reproductive or obstetric history, unspecified trimester: Secondary | ICD-10-CM

## 2016-05-21 DIAGNOSIS — Z01419 Encounter for gynecological examination (general) (routine) without abnormal findings: Secondary | ICD-10-CM | POA: Diagnosis not present

## 2016-05-21 DIAGNOSIS — O99322 Drug use complicating pregnancy, second trimester: Secondary | ICD-10-CM

## 2016-05-21 DIAGNOSIS — F900 Attention-deficit hyperactivity disorder, predominantly inattentive type: Secondary | ICD-10-CM

## 2016-05-21 DIAGNOSIS — O0990 Supervision of high risk pregnancy, unspecified, unspecified trimester: Secondary | ICD-10-CM | POA: Insufficient documentation

## 2016-05-21 LAB — POCT URINALYSIS DIPSTICK
GLUCOSE UA: NEGATIVE
KETONES UA: NEGATIVE
NITRITE UA: NEGATIVE
Protein, UA: NEGATIVE
RBC UA: NEGATIVE

## 2016-05-21 NOTE — Progress Notes (Signed)
Subjective:    Carmen Martin is a [id number] [id number] being seen today for her first obstetrical visit.  Her obstetrical history is significant for 32.week IUFD (unexplained per pt).  Last preganacy, she had IUGR with poor testing at 35.2 weeks.  She was not given trial of labor, taken for CS for 4/10 BPP w/ NR NST.  Unsure if she wants a TOLAC. Is still on large amount of Xanax (2mg  TID) and adderal.  "May try to wean down" adderal, no plans to wean down Xanas.  Discussed how abrupt cessation of xanax can induce seizures, so would only recommend weaning to lower dose.   Long hx narcotic abuse.  UDS pending, [facility].  Pregnancy history fully reviewed.  Patient reports eczema is botheirng her.  Had been on trimalacone, knows to not use w/pregnancy.  Vitals:   05/21/16 1425 05/21/16 1426  BP: 112/60   Pulse: 90   Weight: 125 lb (56.7 kg)   Height:  5\' 1"  (1.549 m)    HISTORY: OB History  Gravida Para Term Preterm AB Living  4 2 0 2 1 1   SAB TAB Ectopic Multiple Live Births  1 0 0 0 1    # Outcome Date GA Lbr Len/2nd Weight Sex Delivery Anes PTL Lv  4 Current           3 Preterm 07/08/14 4726w2d  3 lb 4.6 oz (1.49 kg) F CS-LTranv Spinal  LIV     Birth Comments: SGA, premature, maternal UDS + for THC, oxycodone but baby's UDS neg. No signs of withdrawal  2 Preterm 01/23/12 8566w4d 05:00 / 00:03 2 lb (0.907 kg) M Vag-Spont EPI  FD  1 SAB              Past Medical History:  Diagnosis Date  . ADD (attention deficit disorder)   . ADHD (attention deficit hyperactivity disorder) 11/02/2012  . Anxiety   . Depression   . Eczema   . Headache(784.0)   . Hx MRSA infection    in buttocks  . Narcotic drug use 2015  . Substance abuse    Past Surgical History:  Procedure Laterality Date  . CESAREAN SECTION N/A 07/08/2014   Procedure: CESAREAN SECTION;  Surgeon: Tilda BurrowJohn Ferguson V, MD;  Location: [facility];  Service: Obstetrics;  Laterality: N/A;  . COLONOSCOPY WITH PROPOFOL N/A 09/29/2012   Procedure: COLONOSCOPY WITH PROPOFOL;  Surgeon: Malissa HippoNajeeb U Rehman, MD;  Location: [facility];  Service: Endoscopy;  Laterality: N/A;  at cecum at 0902 total withdrawal time 8minutes  . WISDOM TOOTH EXTRACTION  2014   Family History  Problem Relation Age of Onset  . Diabetes Mother   . Heart disease Mother   . Stroke Mother   . Hypertension Mother   . Depression Father   . Cancer Maternal Grandmother     breast  . Alzheimer's disease Paternal Grandmother   . Stroke Paternal Grandfather      Exam       Pelvic Exam:    Perineum: Normal Perineum   Vulva: normal   Vagina:  normal mucosa, normal discharge, no palpable nodules   Uterus Normal, Gravid, FH: 18     Cervix: normal   Adnexa: Not palpable   Urinary:  urethral meatus normal    System:     Skin: Eczema on various places (arms, stomach, legs)    Neurologic: oriented, normal, normal mood   Extremities: normal strength, tone, and muscle mass   HEENT  PERRLA   Mouth/Teeth mucous membranes moist, normal dentition   Neck supple and no masses   Cardiovascular: regular rate and rhythm   Respiratory:  appears well, vitals normal, no respiratory distress, acyanotic   Abdomen: soft, non-tender;  FHR: 160          Assessment:    Pregnancy: [id number] Patient Active Problem List   Diagnosis Date Noted  . Supervision of normal pregnancy 05/21/2016  . Routine cervical smear 05/21/2016  . Status post cesarean section 07/08/2014  . History of prior pregnancy with IUGR newborn   . Narcotic abuse 07/06/2014  . Long term prescription benzodiazepine use 05/18/2014  . Late prenatal care in second trimester 03/21/2014  . History of intrauterine fetal death, currently pregnant 03/21/2014  . ADHD (attention deficit hyperactivity disorder) 11/02/2012  . Anxiety state, unspecified 09/22/2012  . Depression 09/22/2012        Plan:     Initial labs drawn. Continue prenatal vitamins  Problem list reviewed and update Info on Wet  wraps for eczema given Encouraged ot decrease/wean down xanax;adderal Reviewed recommended weight gain based on pre-gravid BMI  Encouraged well-balanced diet Genetic Screening discussed declined: Marland Kitchen.  Ultrasound discussed; fetal survey: requested.  Return in about 2 weeks (around 06/04/2016) for LROB, AV:[id number]:Anatomy.  CRESENZO-DISHMAN,Salimah Martinovich 05/22/2016

## 2016-05-21 NOTE — Patient Instructions (Addendum)
 First Trimester of Pregnancy The first trimester of pregnancy is from week 1 until the end of week 12 (months 1 through 3). A week after a sperm fertilizes an egg, the egg will implant on the wall of the uterus. This embryo will begin to develop into a baby. Genes from you and your partner are forming the baby. The female genes determine whether the baby is a boy or a girl. At 6-8 weeks, the eyes and face are formed, and the heartbeat can be seen on ultrasound. At the end of 12 weeks, all the baby's organs are formed.  Now that you are pregnant, you will want to do everything you can to have a healthy baby. Two of the most important things are to get good prenatal care and to follow your health care provider's instructions. Prenatal care is all the medical care you receive before the baby's birth. This care will help prevent, find, and treat any problems during the pregnancy and childbirth. BODY CHANGES Your body goes through many changes during pregnancy. The changes vary from woman to woman.   You may gain or lose a couple of pounds at first.  You may feel sick to your stomach (nauseous) and throw up (vomit). If the vomiting is uncontrollable, call your health care provider.  You may tire easily.  You may develop headaches that can be relieved by medicines approved by your health care provider.  You may urinate more often. Painful urination may mean you have a bladder infection.  You may develop heartburn as a result of your pregnancy.  You may develop constipation because certain hormones are causing the muscles that push waste through your intestines to slow down.  You may develop hemorrhoids or swollen, bulging veins (varicose veins).  Your breasts may begin to grow larger and become tender. Your nipples may stick out more, and the tissue that surrounds them (areola) may become darker.  Your gums may bleed and may be sensitive to brushing and flossing.  Dark spots or blotches  (chloasma, mask of pregnancy) may develop on your face. This will likely fade after the baby is born.  Your menstrual periods will stop.  You may have a loss of appetite.  You may develop cravings for certain kinds of food.  You may have changes in your emotions from day to day, such as being excited to be pregnant or being concerned that something may go wrong with the pregnancy and baby.  You may have more vivid and strange dreams.  You may have changes in your hair. These can include thickening of your hair, rapid growth, and changes in texture. Some women also have hair loss during or after pregnancy, or hair that feels dry or thin. Your hair will most likely return to normal after your baby is born. WHAT TO EXPECT AT YOUR PRENATAL VISITS During a routine prenatal visit:  You will be weighed to make sure you and the baby are growing normally.  Your blood pressure will be taken.  Your abdomen will be measured to track your baby's growth.  The fetal heartbeat will be listened to starting around week 10 or 12 of your pregnancy.  Test results from any previous visits will be discussed. Your health care provider may ask you:  How you are feeling.  If you are feeling the baby move.  If you have had any abnormal symptoms, such as leaking fluid, bleeding, severe headaches, or abdominal cramping.  If you have any questions. Other   tests that may be performed during your first trimester include:  Blood tests to find your blood type and to check for the presence of any previous infections. They will also be used to check for low iron levels (anemia) and Rh antibodies. Later in the pregnancy, blood tests for diabetes will be done along with other tests if problems develop.  Urine tests to check for infections, diabetes, or protein in the urine.  An ultrasound to confirm the proper growth and development of the baby.  An amniocentesis to check for possible genetic problems.  Fetal  screens for spina bifida and Down syndrome.  You may need other tests to make sure you and the baby are doing well. HOME CARE INSTRUCTIONS  Medicines  Follow your health care provider's instructions regarding medicine use. Specific medicines may be either safe or unsafe to take during pregnancy.  Take your prenatal vitamins as directed.  If you develop constipation, try taking a stool softener if your health care provider approves. Diet  Eat regular, well-balanced meals. Choose a variety of foods, such as meat or vegetable-based protein, fish, milk and low-fat dairy products, vegetables, fruits, and whole grain breads and cereals. Your health care provider will help you determine the amount of weight gain that is right for you.  Avoid raw meat and uncooked cheese. These carry germs that can cause birth defects in the baby.  Eating four or five small meals rather than three large meals a day may help relieve nausea and vomiting. If you start to feel nauseous, eating a few soda crackers can be helpful. Drinking liquids between meals instead of during meals also seems to help nausea and vomiting.  If you develop constipation, eat more high-fiber foods, such as fresh vegetables or fruit and whole grains. Drink enough fluids to keep your urine clear or pale yellow. Activity and Exercise  Exercise only as directed by your health care provider. Exercising will help you:  Control your weight.  Stay in shape.  Be prepared for labor and delivery.  Experiencing pain or cramping in the lower abdomen or low back is a good sign that you should stop exercising. Check with your health care provider before continuing normal exercises.  Try to avoid standing for long periods of time. Move your legs often if you must stand in one place for a long time.  Avoid heavy lifting.  Wear low-heeled shoes, and practice good posture.  You may continue to have sex unless your health care provider directs you  otherwise. Relief of Pain or Discomfort  Wear a good support bra for breast tenderness.   Take warm sitz baths to soothe any pain or discomfort caused by hemorrhoids. Use hemorrhoid cream if your health care provider approves.   Rest with your legs elevated if you have leg cramps or low back pain.  If you develop varicose veins in your legs, wear support hose. Elevate your feet for 15 minutes, 3-4 times a day. Limit salt in your diet. Prenatal Care  Schedule your prenatal visits by the twelfth week of pregnancy. They are usually scheduled monthly at first, then more often in the last 2 months before delivery.  Write down your questions. Take them to your prenatal visits.  Keep all your prenatal visits as directed by your health care provider. Safety  Wear your seat belt at all times when driving.  Make a list of emergency phone numbers, including numbers for family, friends, the hospital, and police and fire departments. General   Tips  Ask your health care provider for a referral to a local prenatal education class. Begin classes no later than at the beginning of month 6 of your pregnancy.  Ask for help if you have counseling or nutritional needs during pregnancy. Your health care provider can offer advice or refer you to specialists for help with various needs.  Do not use hot tubs, steam rooms, or saunas.  Do not douche or use tampons or scented sanitary pads.  Do not cross your legs for long periods of time.  Avoid cat litter boxes and soil used by cats. These carry germs that can cause birth defects in the baby and possibly loss of the fetus by miscarriage or stillbirth.  Avoid all smoking, herbs, alcohol, and medicines not prescribed by your health care provider. Chemicals in these affect the formation and growth of the baby.  Schedule a dentist appointment. At home, brush your teeth with a soft toothbrush and be gentle when you floss. SEEK MEDICAL CARE IF:   You have  dizziness.  You have mild pelvic cramps, pelvic pressure, or nagging pain in the abdominal area.  You have persistent nausea, vomiting, or diarrhea.  You have a bad smelling vaginal discharge.  You have pain with urination.  You notice increased swelling in your face, hands, legs, or ankles. SEEK IMMEDIATE MEDICAL CARE IF:   You have a fever.  You are leaking fluid from your vagina.  You have spotting or bleeding from your vagina.  You have severe abdominal cramping or pain.  You have rapid weight gain or loss.  You vomit blood or material that looks like coffee grounds.  You are exposed to German measles and have never had them.  You are exposed to fifth disease or chickenpox.  You develop a severe headache.  You have shortness of breath.  You have any kind of trauma, such as from a fall or a car accident. Document Released: 06/17/2001 Document Revised: 11/07/2013 Document Reviewed: 05/03/2013 [employer] Patient Information 2015 [employer]. This information is not intended to replace advice given to you by your health care provider. Make sure you discuss any questions you have with your health care provider.   Nausea & Vomiting  Have saltine crackers or pretzels by your bed and eat a few bites before you raise your head out of bed in the morning  Eat small frequent meals throughout the day instead of large meals  Drink plenty of fluids throughout the day to stay hydrated, just don't drink a lot of fluids with your meals.  This can make your stomach fill up faster making you feel sick  Do not brush your teeth right after you eat  Products with real ginger are good for nausea, like ginger ale and ginger hard candy Make sure it says made with real ginger!  Sucking on sour candy like lemon heads is also good for nausea  If your prenatal vitamins make you nauseated, take them at night so you will sleep through the nausea  Sea Bands  If you feel like you need  medicine for the nausea & vomiting please let us know  If you are unable to keep any fluids or food down please let us know   Constipation  Drink plenty of fluid, preferably water, throughout the day  Eat foods high in fiber such as fruits, vegetables, and grains  Exercise, such as walking, is a good way to keep your bowels regular  Drink warm fluids, especially warm   prune juice, or decaf coffee  Eat a 1/2 cup of real oatmeal (not instant), 1/2 cup applesauce, and 1/2-1 cup warm prune juice every day  If needed, you may take Colace (docusate sodium) stool softener once or twice a day to help keep the stool soft. If you are pregnant, wait until you are out of your first trimester (12-14 weeks of pregnancy)  If you still are having problems with constipation, you may take Miralax once daily as needed to help keep your bowels regular.  If you are pregnant, wait until you are out of your first trimester (12-14 weeks of pregnancy)  Safe Medications in Pregnancy   Acne: Benzoyl Peroxide Salicylic Acid  Backache/Headache: Tylenol: 2 regular strength every 4 hours OR              2 Extra strength every 6 hours  Colds/Coughs/Allergies: Benadryl (alcohol free) 25 mg every 6 hours as needed Breath right strips Claritin Cepacol throat lozenges Chloraseptic throat spray Cold-Eeze- up to three times per day Cough drops, alcohol free Flonase (by prescription only) Guaifenesin Mucinex Robitussin DM (plain only, alcohol free) Saline nasal spray/drops Sudafed (pseudoephedrine) & Actifed ** use only after [redacted] weeks gestation and if you do not have high blood pressure Tylenol Vicks Vaporub Zinc lozenges Zyrtec   Constipation: Colace Ducolax suppositories Fleet enema Glycerin suppositories Metamucil Milk of magnesia Miralax Senokot Smooth move tea  Diarrhea: Kaopectate Imodium A-D  *NO pepto Bismol  Hemorrhoids: Anusol Anusol HC Preparation  H Tucks  Indigestion: Tums Maalox Mylanta Zantac  Pepcid  Insomnia: Benadryl (alcohol free) 25mg  every 6 hours as needed Tylenol PM Unisom, no Gelcaps  Leg Cramps: Tums MagGel  Nausea/Vomiting:  Bonine Dramamine Emetrol Ginger extract Sea bands Meclizine  Nausea medication to take during pregnancy:  Unisom (doxylamine succinate 25 mg tablets) Take one tablet daily at bedtime. If symptoms are not adequately controlled, the dose can be increased to a maximum recommended dose of two tablets daily (1/2 tablet in the morning, 1/2 tablet mid-afternoon and one at bedtime). Vitamin B6 100mg  tablets. Take one tablet twice a day (up to 200 mg per day).  Skin Rashes: Aveeno products Benadryl cream or 25mg  every 6 hours as needed Calamine Lotion 1% cortisone cream  Yeast infection: Gyne-lotrimin 7 Monistat 7   **If taking multiple medications, please check labels to avoid duplicating the same active ingredients **take medication as directed on the label ** Do not exceed 4000 mg of tylenol in 24 hours **Do not take medications that contain aspirin or ibuprofen     What Is Wet Wrap Therapy for Eczema? It's simple - soak and seal. Wet wrapping is when you soak the skin in water, then seal with a moisturizer and damp clothing to hydrate the skin for an extended period of time. Here's how to do it in 6 easy steps!  Soak in lukewarm bath water for 15-20 minutes. No soap! Afterwards the skin is lightly dried, then sealed with a heavy layer of cream or balm/salve. (such as Aveeno or Eucerin) Next dress the skin with damp clothing or a layer of damp bandages. Then top with a layer of dry clothing or bandages. Leave the wraps on for at least two hours. Add another layer of cream or balm salve, then dress as usual. works well for psoriasis, allergic contact dermatitis, and dermatomyositis. This can be repeated several times a day

## 2016-05-23 LAB — URINE CULTURE

## 2016-05-23 LAB — CYTOLOGY - PAP
Chlamydia: NEGATIVE
Diagnosis: NEGATIVE
NEISSERIA GONORRHEA: NEGATIVE

## 2016-05-27 ENCOUNTER — Other Ambulatory Visit: Payer: Self-pay | Admitting: Advanced Practice Midwife

## 2016-05-27 ENCOUNTER — Telehealth: Payer: Self-pay | Admitting: *Deleted

## 2016-05-27 MED ORDER — CEPHALEXIN 500 MG PO CAPS: 500.0000 mg | ORAL_CAPSULE | Freq: Three times a day (TID) | ORAL | 0 refills | Status: DC

## 2016-05-27 NOTE — Progress Notes (Signed)
Keflex for ASB

## 2016-06-04 ENCOUNTER — Encounter: Payer: Medicaid Other | Admitting: Advanced Practice Midwife

## 2016-06-05 ENCOUNTER — Encounter: Payer: Medicaid Other | Admitting: Women's Health

## 2016-06-05 ENCOUNTER — Other Ambulatory Visit: Payer: Medicaid Other

## 2016-06-05 ENCOUNTER — Ambulatory Visit (INDEPENDENT_AMBULATORY_CARE_PROVIDER_SITE_OTHER): Payer: Medicaid Other

## 2016-06-05 DIAGNOSIS — Z3A21 21 weeks gestation of pregnancy: Secondary | ICD-10-CM | POA: Diagnosis not present

## 2016-06-05 DIAGNOSIS — Z3482 Encounter for supervision of other normal pregnancy, second trimester: Secondary | ICD-10-CM

## 2016-06-05 DIAGNOSIS — Z8759 Personal history of other complications of pregnancy, childbirth and the puerperium: Secondary | ICD-10-CM

## 2016-06-05 DIAGNOSIS — Z363 Encounter for antenatal screening for malformations: Secondary | ICD-10-CM | POA: Diagnosis not present

## 2016-06-05 NOTE — Progress Notes (Signed)
Pt aware she has a UTI and med was sent to pharmacy. Pt states she will pick up med. Return in 2 weeks per Selena BattenKim, CNM. JSY

## 2016-06-05 NOTE — Progress Notes (Signed)
US 20+4 wks,cephalic,post pl gr 0,normal ov's bilat,svp of fluid 5.6 cm,fhr 137 bpm,cx 3.6 cm,efw 323 g,anatomy complete,no obvious abnormalities seen

## 2016-06-07 LAB — PMP SCREEN PROFILE (10S), URINE
AMPHETAMINE SCRN UR: NEGATIVE ng/mL
BENZODIAZEPINE SCREEN, URINE: NEGATIVE ng/mL
Barbiturate Screen, Ur: NEGATIVE ng/mL
CANNABINOIDS UR QL SCN: NEGATIVE ng/mL
COCAINE(METAB.) SCREEN, URINE: NEGATIVE ng/mL
Creatinine(Crt), U: 17.4 mg/dL — ABNORMAL LOW (ref 20.0–300.0)
Methadone Scn, Ur: NEGATIVE ng/mL
Opiate Scrn, Ur: POSITIVE ng/mL
Oxycodone+Oxymorphone Ur Ql Scn: NEGATIVE ng/mL
PCP SCRN UR: NEGATIVE ng/mL
PH UR, DRUG SCRN: 6.7 (ref 4.5–8.9)
Propoxyphene, Screen: NEGATIVE ng/mL

## 2016-06-07 LAB — SPECIFIC GRAVITY (REFLEXED): SPECIFIC GRAVITY: 1.0022

## 2016-06-17 NOTE — Telephone Encounter (Signed)
Chart opened in error

## 2016-06-19 ENCOUNTER — Encounter: Payer: Medicaid Other | Admitting: Advanced Practice Midwife

## 2016-06-19 ENCOUNTER — Encounter: Payer: Self-pay | Admitting: *Deleted

## 2016-09-04 ENCOUNTER — Encounter: Payer: Self-pay | Admitting: Women's Health

## 2016-09-04 ENCOUNTER — Ambulatory Visit (INDEPENDENT_AMBULATORY_CARE_PROVIDER_SITE_OTHER): Payer: Medicaid Other | Admitting: Women's Health

## 2016-09-04 VITALS — BP 110/64 | HR 88 | Wt 129.6 lb

## 2016-09-04 DIAGNOSIS — Z331 Pregnant state, incidental: Secondary | ICD-10-CM

## 2016-09-04 DIAGNOSIS — Z8759 Personal history of other complications of pregnancy, childbirth and the puerperium: Secondary | ICD-10-CM

## 2016-09-04 DIAGNOSIS — O09293 Supervision of pregnancy with other poor reproductive or obstetric history, third trimester: Secondary | ICD-10-CM | POA: Diagnosis not present

## 2016-09-04 DIAGNOSIS — Z3A34 34 weeks gestation of pregnancy: Secondary | ICD-10-CM | POA: Diagnosis not present

## 2016-09-04 DIAGNOSIS — O0933 Supervision of pregnancy with insufficient antenatal care, third trimester: Secondary | ICD-10-CM | POA: Diagnosis not present

## 2016-09-04 DIAGNOSIS — Z79899 Other long term (current) drug therapy: Secondary | ICD-10-CM

## 2016-09-04 DIAGNOSIS — Z1389 Encounter for screening for other disorder: Secondary | ICD-10-CM

## 2016-09-04 DIAGNOSIS — O09299 Supervision of pregnancy with other poor reproductive or obstetric history, unspecified trimester: Secondary | ICD-10-CM

## 2016-09-04 DIAGNOSIS — O99323 Drug use complicating pregnancy, third trimester: Secondary | ICD-10-CM

## 2016-09-04 DIAGNOSIS — O2343 Unspecified infection of urinary tract in pregnancy, third trimester: Secondary | ICD-10-CM | POA: Diagnosis not present

## 2016-09-04 DIAGNOSIS — O26843 Uterine size-date discrepancy, third trimester: Secondary | ICD-10-CM

## 2016-09-04 DIAGNOSIS — O0930 Supervision of pregnancy with insufficient antenatal care, unspecified trimester: Secondary | ICD-10-CM | POA: Insufficient documentation

## 2016-09-04 DIAGNOSIS — O0990 Supervision of high risk pregnancy, unspecified, unspecified trimester: Secondary | ICD-10-CM

## 2016-09-04 DIAGNOSIS — Z98891 History of uterine scar from previous surgery: Secondary | ICD-10-CM

## 2016-09-04 MED ORDER — PRENATAL PLUS 27-1 MG PO TABS: 1.0000 | ORAL_TABLET | Freq: Every day | ORAL | 12 refills | Status: DC

## 2016-09-04 NOTE — Progress Notes (Signed)
High Risk Pregnancy Diagnosis(es): h/o 32wk IUFD, h/o IUGR, limited pnc, xanax/adderall and uds+opiates w/o rx [id number] Estimated Date of Delivery: 10/19/16 BP 110/64   Pulse 88   Wt 129 lb 9.6 oz (58.8 kg)   LMP  (LMP Unknown)   BMI 24.49 kg/m   Urinalysis: Negative HPI:  Reports decreased fm x 2wks. Denies regular uc's, lof, vb, uti s/s.  No care since 18wks at her new ob visit. States she has been 'bouncing around' from house to house, hasn't had transportation. Has been living w/ a friend in [place] for past month, so currently has stable housing. Recently made contact w/ sister who was concerned she hadn't been receiving pnc, so she called to make today's appt for pt and brought her.  Reports she is still taking xanax but has decreased from 2mg  tid to 1mg  bid, [place] 3-375mths per her report, also still on adderral. Denies any other drug use.  Prev SVB w/ 32wk IUFD then c/s @ 35wks d/t IUGR w/ bpp 4/8- no labor, discussed risks/benefits of vbac, interested, to take consent home to review  BP, weight, and urine reviewed.   Fundal Height:  29 Fetal Heart rate:  130, reactive NST Edema:  none  Reviewed ptl s/s, fkc, Recommended Tdap at HD/PCP per CDC guidelines All questions were answered Assessment: 4230w5d h/o 32wk IUFD, h/o IUGR, limited pnc, xanax/adderall and uds+opiates w/o rx Medication(s) Plans:  Discussed xanax use during pregnancy and baby may potentially go through withdrawals Treatment Plan:   Needs growth u/s asap         2x/wk testing           Deliver @ 39wks Follow up on Tues for high-risk OB appt w/ MD, pn2, and bpp/efw u/s Had pt talk w/ [place], pregnancy care manager after our visit

## 2016-09-04 NOTE — Patient Instructions (Addendum)
You will have your sugar test next visit.  Please do not eat or drink anything after midnight the night before you come, not even water.  You will be here for at least two hours.     Tdap Vaccine  It is recommended that you get the Tdap vaccine during the third trimester of EACH pregnancy to help protect your baby from getting pertussis (whooping cough)  27-36 weeks is the BEST time to do this so that you can pass the protection on to your baby. During pregnancy is better than after pregnancy, but if you are unable to get it during pregnancy it will be offered at the hospital.   You can get this vaccine at the health department or your family doctor  Everyone who will be around your baby should also be up-to-date on their vaccines. Adults (who are not pregnant) only need 1 dose of Tdap during adulthood.    Call the office [phone]) or go to [facility] if:  You begin to have strong, frequent contractions  Your water breaks.  Sometimes it is a big gush of fluid, sometimes it is just a trickle that keeps getting your panties wet or running down your legs  You have vaginal bleeding.  It is normal to have a small amount of spotting if your cervix was checked.   You don't feel your baby moving like normal.  If you don't, get you something to eat and drink and lay down and focus on feeling your baby move.  You should feel at least 10 movements in 2 hours.  If you don't, you should call the office or go to [facility].   [place] Pediatricians/Family Doctors:  [facility] [phone]            [facility] [phone]                 [facility] [phone] (usually not accepting new patients unless you have family there already, you are always welcome to call and ask)            [facility] ([address] [place]) [phone]   [facility] Pediatricians/Family Doctors:   [facility]:  [phone]  [facility]: [phone]      Third Trimester of Pregnancy The third trimester is from week 28 through week 40 (months 7 through 9). The third trimester is a time when the unborn baby (fetus) is growing rapidly. At the end of the ninth month, the fetus is about 20 inches in length and weighs 6-10 pounds. Body changes during your third trimester Your body will continue to go through many changes during pregnancy. The changes vary from woman to woman. During the third trimester:  Your weight will continue to increase. You can expect to gain 25-35 pounds (11-16 kg) by the end of the pregnancy.  You may begin to get stretch marks on your hips, abdomen, and breasts.  You may urinate more often because the fetus is moving lower into your pelvis and pressing on your bladder.  You may develop or continue to have heartburn. This is caused by increased hormones that slow down muscles in the digestive tract.  You may develop or continue to have constipation because increased hormones slow digestion and cause the muscles that push waste through your intestines to relax.  You may develop hemorrhoids. These are swollen veins (varicose veins) in the rectum that can itch or be painful.  You may develop swollen, bulging  veins (varicose veins) in your legs.  You may have increased body aches in the pelvis, back, or thighs. This is due to weight gain and increased hormones that are relaxing your joints.  You may have changes in your hair. These can include thickening of your hair, rapid growth, and changes in texture. Some women also have hair loss during or after pregnancy, or hair that feels dry or thin. Your hair will most likely return to normal after your baby is born.  Your breasts will continue to grow and they will continue to become tender. A yellow fluid (colostrum) may leak from your breasts. This is the first milk you are producing for your baby.  Your belly button  may stick out.  You may notice more swelling in your hands, face, or ankles.  You may have increased tingling or numbness in your hands, arms, and legs. The skin on your belly may also feel numb.  You may feel short of breath because of your expanding uterus.  You may have more problems sleeping. This can be caused by the size of your belly, increased need to urinate, and an increase in your body's metabolism.  You may notice the fetus "dropping," or moving lower in your abdomen (lightening).  You may have increased vaginal discharge.  You may notice your joints feel loose and you may have pain around your pelvic bone. What to expect at prenatal visits You will have prenatal exams every 2 weeks until week 36. Then you will have weekly prenatal exams. During a routine prenatal visit:  You will be weighed to make sure you and the baby are growing normally.  Your blood pressure will be taken.  Your abdomen will be measured to track your baby's growth.  The fetal heartbeat will be listened to.  Any test results from the previous visit will be discussed.  You may have a cervical check near your due date to see if your cervix has softened or thinned (effaced).  You will be tested for Group B streptococcus. This happens between 35 and 37 weeks. Your health care provider may ask you:  What your birth plan is.  How you are feeling.  If you are feeling the baby move.  If you have had any abnormal symptoms, such as leaking fluid, bleeding, severe headaches, or abdominal cramping.  If you are using any tobacco products, including cigarettes, chewing tobacco, and electronic cigarettes.  If you have any questions. Other tests or screenings that may be performed during your third trimester include:  Blood tests that check for low iron levels (anemia).  Fetal testing to check the health, activity level, and growth of the fetus. Testing is done if you have certain medical conditions or  if there are problems during the pregnancy.  Nonstress test (NST). This test checks the health of your baby to make sure there are no signs of problems, such as the baby not getting enough oxygen. During this test, a belt is placed around your belly. The baby is made to move, and its heart rate is monitored during movement. What is false labor? False labor is a condition in which you feel small, irregular tightenings of the muscles in the womb (contractions) that usually go away with rest, changing position, or drinking water. These are called Braxton Hicks contractions. Contractions may last for hours, days, or even weeks before true labor sets in. If contractions come at regular intervals, become more frequent, increase in intensity, or become painful, you  should see your health care provider. What are the signs of labor?  Abdominal cramps.  Regular contractions that start at 10 minutes apart and become stronger and more frequent with time.  Contractions that start on the top of the uterus and spread down to the lower abdomen and back.  Increased pelvic pressure and dull back pain.  A watery or bloody mucus discharge that comes from the vagina.  Leaking of amniotic fluid. This is also known as your "water breaking." It could be a slow trickle or a gush. Let your health care provider know if it has a color or strange odor. If you have any of these signs, call your health care provider right away, even if it is before your due date. Follow these instructions at home: Medicines   Follow your health care provider's instructions regarding medicine use. Specific medicines may be either safe or unsafe to take during pregnancy.  Take a prenatal vitamin that contains at least 600 micrograms (mcg) of folic acid.  If you develop constipation, try taking a stool softener if your health care provider approves. Eating and drinking   Eat a balanced diet that includes fresh fruits and vegetables,  whole grains, good sources of protein such as meat, eggs, or tofu, and low-fat dairy. Your health care provider will help you determine the amount of weight gain that is right for you.  Avoid raw meat and uncooked cheese. These carry germs that can cause birth defects in the baby.  If you have low calcium intake from food, talk to your health care provider about whether you should take a daily calcium supplement.  Eat four or five small meals rather than three large meals a day.  Limit foods that are high in fat and processed sugars, such as fried and sweet foods.  To prevent constipation:  Drink enough fluid to keep your urine clear or pale yellow.  Eat foods that are high in fiber, such as fresh fruits and vegetables, whole grains, and beans. Activity   Exercise only as directed by your health care provider. Most women can continue their usual exercise routine during pregnancy. Try to exercise for 30 minutes at least 5 days a week. Stop exercising if you experience uterine contractions.  Avoid heavy lifting.  Do not exercise in extreme heat or humidity, or at high altitudes.  Wear low-heel, comfortable shoes.  Practice good posture.  You may continue to have sex unless your health care provider tells you otherwise. Relieving pain and discomfort   Take frequent breaks and rest with your legs elevated if you have leg cramps or low back pain.  Take warm sitz baths to soothe any pain or discomfort caused by hemorrhoids. Use hemorrhoid cream if your health care provider approves.  Wear a good support bra to prevent discomfort from breast tenderness.  If you develop varicose veins:  Wear support pantyhose or compression stockings as told by your healthcare provider.  Elevate your feet for 15 minutes, 3-4 times a day. Prenatal care   Write down your questions. Take them to your prenatal visits.  Keep all your prenatal visits as told by your health care provider. This is  important. Safety   Wear your seat belt at all times when driving.  Make a list of emergency phone numbers, including numbers for family, friends, the hospital, and police and fire departments. General instructions   Avoid cat litter boxes and soil used by cats. These carry germs that can cause birth defects  in the baby. If you have a cat, ask someone to clean the litter box for you.  Do not travel far distances unless it is absolutely necessary and only with the approval of your health care provider.  Do not use hot tubs, steam rooms, or saunas.  Do not drink alcohol.  Do not use any products that contain nicotine or tobacco, such as cigarettes and e-cigarettes. If you need help quitting, ask your health care provider.  Do not use any medicinal herbs or unprescribed drugs. These chemicals affect the formation and growth of the baby.  Do not douche or use tampons or scented sanitary pads.  Do not cross your legs for long periods of time.  To prepare for the arrival of your baby:  Take prenatal classes to understand, practice, and ask questions about labor and delivery.  Make a trial run to the hospital.  Visit the hospital and tour the maternity area.  Arrange for maternity or paternity leave through employers.  Arrange for family and friends to take care of pets while you are in the hospital.  Purchase a rear-facing car seat and make sure you know how to install it in your car.  Pack your hospital bag.  Prepare the baby's nursery. Make sure to remove all pillows and stuffed animals from the baby's crib to prevent suffocation.  Visit your dentist if you have not gone during your pregnancy. Use a soft toothbrush to brush your teeth and be gentle when you floss. Contact a health care provider if:  You are unsure if you are in labor or if your water has broken.  You become dizzy.  You have mild pelvic cramps, pelvic pressure, or nagging pain in your abdominal  area.  You have lower back pain.  You have persistent nausea, vomiting, or diarrhea.  You have an unusual or bad smelling vaginal discharge.  You have pain when you urinate. Get help right away if:  Your water breaks before 37 weeks.  You have regular contractions less than 5 minutes apart before 37 weeks.  You have a fever.  You are leaking fluid from your vagina.  You have spotting or bleeding from your vagina.  You have severe abdominal pain or cramping.  You have rapid weight loss or weight gain.  You have shortness of breath with chest pain.  You notice sudden or extreme swelling of your face, hands, ankles, feet, or legs.  Your baby makes fewer than 10 movements in 2 hours.  You have severe headaches that do not go away when you take medicine.  You have vision changes. Summary  The third trimester is from week 28 through week 40, months 7 through 9. The third trimester is a time when the unborn baby (fetus) is growing rapidly.  During the third trimester, your discomfort may increase as you and your baby continue to gain weight. You may have abdominal, leg, and back pain, sleeping problems, and an increased need to urinate.  During the third trimester your breasts will keep growing and they will continue to become tender. A yellow fluid (colostrum) may leak from your breasts. This is the first milk you are producing for your baby.  False labor is a condition in which you feel small, irregular tightenings of the muscles in the womb (contractions) that eventually go away. These are called Braxton Hicks contractions. Contractions may last for hours, days, or even weeks before true labor sets in.  Signs of labor can include: abdominal cramps;  regular contractions that start at 10 minutes apart and become stronger and more frequent with time; watery or bloody mucus discharge that comes from the vagina; increased pelvic pressure and dull back pain; and leaking of amniotic  fluid. This information is not intended to replace advice given to you by your health care provider. Make sure you discuss any questions you have with your health care provider. Document Released: 06/17/2001 Document Revised: 11/29/2015 Document Reviewed: 08/24/2012 Elsevier Interactive Patient Education  2017 [employer]

## 2016-09-05 ENCOUNTER — Telehealth: Payer: Self-pay | Admitting: *Deleted

## 2016-09-05 LAB — PMP SCREEN PROFILE (10S), URINE
AMPHETAMINE SCRN UR: NEGATIVE ng/mL
Barbiturate Screen, Ur: NEGATIVE ng/mL
Benzodiazepine Screen, Urine: NEGATIVE ng/mL
CANNABINOIDS UR QL SCN: NEGATIVE ng/mL
COCAINE(METAB.) SCREEN, URINE: NEGATIVE ng/mL
Creatinine(Crt), U: 77.5 mg/dL (ref 20.0–300.0)
METHADONE SCREEN, URINE: NEGATIVE ng/mL
OXYCODONE+OXYMORPHONE UR QL SCN: NEGATIVE ng/mL
Opiate Scrn, Ur: NEGATIVE ng/mL
PCP SCRN UR: NEGATIVE ng/mL
PROPOXYPHENE SCREEN: NEGATIVE ng/mL
Ph of Urine: 8.1 (ref 4.5–8.9)

## 2016-09-05 NOTE — Telephone Encounter (Signed)
Patient called stating she has not felt the baby move in 2 hours despite eating and drinking. Advised patient that with her medical history [facility] needed [facility]. Pt verbalized understanding.

## 2016-09-09 ENCOUNTER — Inpatient Hospital Stay (HOSPITAL_COMMUNITY)
Admission: AD | Admit: 2016-09-09 | Discharge: 2016-09-09 | Disposition: A | Discharge status: Home / Self Care | Payer: Medicaid Other | Source: Ambulatory Visit | Attending: Obstetrics & Gynecology | Admitting: Obstetrics & Gynecology

## 2016-09-09 ENCOUNTER — Ambulatory Visit (INDEPENDENT_AMBULATORY_CARE_PROVIDER_SITE_OTHER): Payer: Medicaid Other

## 2016-09-09 ENCOUNTER — Encounter (HOSPITAL_COMMUNITY): Payer: Self-pay | Admitting: Student

## 2016-09-09 ENCOUNTER — Other Ambulatory Visit: Payer: Self-pay | Admitting: Women's Health

## 2016-09-09 DIAGNOSIS — Z590 Homelessness: Secondary | ICD-10-CM | POA: Insufficient documentation

## 2016-09-09 DIAGNOSIS — O26843 Uterine size-date discrepancy, third trimester: Secondary | ICD-10-CM | POA: Diagnosis not present

## 2016-09-09 DIAGNOSIS — O99343 Other mental disorders complicating pregnancy, third trimester: Secondary | ICD-10-CM | POA: Diagnosis not present

## 2016-09-09 DIAGNOSIS — N898 Other specified noninflammatory disorders of vagina: Secondary | ICD-10-CM | POA: Diagnosis present

## 2016-09-09 DIAGNOSIS — F419 Anxiety disorder, unspecified: Secondary | ICD-10-CM | POA: Diagnosis not present

## 2016-09-09 DIAGNOSIS — O365930 Maternal care for other known or suspected poor fetal growth, third trimester, not applicable or unspecified: Secondary | ICD-10-CM

## 2016-09-09 DIAGNOSIS — Z8759 Personal history of other complications of pregnancy, childbirth and the puerperium: Secondary | ICD-10-CM

## 2016-09-09 DIAGNOSIS — Z3A34 34 weeks gestation of pregnancy: Secondary | ICD-10-CM | POA: Insufficient documentation

## 2016-09-09 DIAGNOSIS — F909 Attention-deficit hyperactivity disorder, unspecified type: Secondary | ICD-10-CM | POA: Diagnosis not present

## 2016-09-09 DIAGNOSIS — O99333 Smoking (tobacco) complicating pregnancy, third trimester: Secondary | ICD-10-CM | POA: Insufficient documentation

## 2016-09-09 DIAGNOSIS — Z79899 Other long term (current) drug therapy: Secondary | ICD-10-CM

## 2016-09-09 DIAGNOSIS — O09293 Supervision of pregnancy with other poor reproductive or obstetric history, third trimester: Secondary | ICD-10-CM

## 2016-09-09 DIAGNOSIS — F329 Major depressive disorder, single episode, unspecified: Secondary | ICD-10-CM | POA: Insufficient documentation

## 2016-09-09 DIAGNOSIS — O0933 Supervision of pregnancy with insufficient antenatal care, third trimester: Secondary | ICD-10-CM

## 2016-09-09 DIAGNOSIS — O23593 Infection of other part of genital tract in pregnancy, third trimester: Secondary | ICD-10-CM | POA: Diagnosis not present

## 2016-09-09 DIAGNOSIS — N760 Acute vaginitis: Secondary | ICD-10-CM | POA: Insufficient documentation

## 2016-09-09 DIAGNOSIS — B9689 Other specified bacterial agents as the cause of diseases classified elsewhere: Secondary | ICD-10-CM

## 2016-09-09 DIAGNOSIS — Z0371 Encounter for suspected problem with amniotic cavity and membrane ruled out: Secondary | ICD-10-CM | POA: Diagnosis not present

## 2016-09-09 DIAGNOSIS — O0990 Supervision of high risk pregnancy, unspecified, unspecified trimester: Secondary | ICD-10-CM

## 2016-09-09 DIAGNOSIS — O26893 Other specified pregnancy related conditions, third trimester: Secondary | ICD-10-CM | POA: Diagnosis not present

## 2016-09-09 LAB — RAPID URINE DRUG SCREEN, HOSP PERFORMED
Amphetamines: NOT DETECTED
Barbiturates: NOT DETECTED
Benzodiazepines: POSITIVE — AB
Cocaine: NOT DETECTED
Opiates: POSITIVE — AB
Tetrahydrocannabinol: NOT DETECTED

## 2016-09-09 LAB — URINALYSIS, ROUTINE W REFLEX MICROSCOPIC
BILIRUBIN URINE: NEGATIVE
Glucose, UA: NEGATIVE mg/dL
Ketones, ur: NEGATIVE mg/dL
Leukocytes, UA: NEGATIVE
Nitrite: NEGATIVE
PH: 6.5 (ref 5.0–8.0)
Protein, ur: NEGATIVE mg/dL
SPECIFIC GRAVITY, URINE: 1.02 (ref 1.005–1.030)

## 2016-09-09 LAB — AMNISURE RUPTURE OF MEMBRANE (ROM) NOT AT ARMC: Amnisure ROM: NEGATIVE

## 2016-09-09 LAB — WET PREP, GENITAL
Sperm: NONE SEEN
TRICH WET PREP: NONE SEEN
YEAST WET PREP: NONE SEEN

## 2016-09-09 LAB — POCT FERN TEST: POCT Fern Test: NEGATIVE

## 2016-09-09 LAB — URINALYSIS, MICROSCOPIC (REFLEX)

## 2016-09-09 MED ORDER — METRONIDAZOLE 500 MG PO TABS
500.0000 mg | ORAL_TABLET | Freq: Once | ORAL | Status: AC
  Administered 2016-09-09: 500 mg via ORAL
  Filled 2016-09-09: qty 1

## 2016-09-09 MED ORDER — METRONIDAZOLE 500 MG PO TABS: 500.0000 mg | ORAL_TABLET | Freq: Two times a day (BID) | ORAL | 0 refills | Status: DC

## 2016-09-09 NOTE — Progress Notes (Signed)
US 34+2 wks,cephalic,BPP 8/8,post pl gr 1,bilat adnexa's wnl,fhr 141 bpm,afi 11.4 cm,RI .71-.68 90TH%,EFW 1643 g <3% [place],NST and f/u appt 09/12/2016 per.Dr.Eure.

## 2016-09-09 NOTE — MAU Note (Addendum)
Pt presents via EMS states water broke around 7pm-clear. Denies vaginal bleeding. Contractions about every 10 mins. Decrease in fetal movement today.

## 2016-09-09 NOTE — MAU Provider Note (Signed)
History     CSN: [id number]  Arrival date and time: 09/09/16 1943   First Provider Initiated Contact with Patient 09/09/16 2005      Chief Complaint  Patient presents with  . Rupture of Membranes   HPI Carmen Martin is a 21 y.o. [phone] at 1821w2d (by early u/s) who presents via EMS with complaints of LOF & low back pain. Reports leaking clear fluid since 7 pm. Leaking has left underwear & pants wet. Unsure if it has continued. Also complains of constant low back pain since leaking began. Low back pain radiates to pelvis. Denies abdominal pain. Rates pain 5/10. Has not treated. No fetal movement for the last 2 hours. Denies vaginal bleeding.   [facility] -- has had limited prenatal care d/t homelessness. Hx of polysubstance abuse. Currently has prescription for xanax & adderal. Denies taking her medications today. When pt was transferred to bed from the stretcher, 2 cut down straws were found in her bra & sock. Pt denies illicit drug use.   OB History    Gravida Para Term Preterm AB Living   4 2 0 2 1 1    SAB TAB Ectopic Multiple Live Births   1 0 0 0 1      Past Medical History:  Diagnosis Date  . ADHD (attention deficit hyperactivity disorder) 11/02/2012  . Anxiety   . Depression   . Eczema   . Headache(784.0)   . Hx MRSA infection    in buttocks  . Narcotic drug use 2015  . Substance abuse     Past Surgical History:  Procedure Laterality Date  . CESAREAN SECTION N/A 07/08/2014   Procedure: CESAREAN SECTION;  Surgeon: Tilda BurrowJohn Ferguson V, MD;  Location: [facility];  Service: Obstetrics;  Laterality: N/A;  . COLONOSCOPY WITH PROPOFOL N/A 09/29/2012   Procedure: COLONOSCOPY WITH PROPOFOL;  Surgeon: Malissa HippoNajeeb U Rehman, MD;  Location: [facility];  Service: Endoscopy;  Laterality: N/A;  at cecum at 0902 total withdrawal time 8minutes  . WISDOM TOOTH EXTRACTION  2014    Family History  Problem Relation Age of Onset  . Diabetes Mother   . Heart disease Mother   . Stroke Mother    . Hypertension Mother   . Depression Father   . Cancer Maternal Grandmother     breast  . Alzheimer's disease Paternal Grandmother   . Stroke Paternal Grandfather     Social History  Substance Use Topics  . Smoking status: Current Some Day Smoker    Packs/day: 0.50    Years: 4.00    Types: Cigarettes  . Smokeless tobacco: Never Used     Comment: pt states less than 1/2 pack cig per week  . Alcohol use No    Allergies:  Allergies  Allergen Reactions  . Biaxin [Clarithromycin] Anaphylaxis  . Vicodin [Hydrocodone-Acetaminophen] Itching and Other (See Comments)    Pt states that it makes her face hot.      Prescriptions Prior to Admission  Medication Sig Dispense Refill Last Dose  . ALPRAZolam (XANAX) 1 MG tablet Take 1 mg by mouth 2 (two) times daily.   09/09/2016 at Unknown time  . amphetamine-dextroamphetamine (ADDERALL) 15 MG tablet Take 15 mg by mouth daily.   09/08/2016 at Unknown time  . prenatal vitamin w/FE, FA (PRENATAL 1 + 1) 27-1 MG TABS tablet Take 1 tablet by mouth daily at 12 noon. 30 each 12 09/09/2016 at Unknown time  . cephALEXin (KEFLEX) 500 MG capsule Take  1 capsule (500 mg total) by mouth 3 (three) times daily. (Patient not taking: Reported on 09/04/2016) 21 capsule 0 Not Taking  . triamcinolone cream (KENALOG) 0.1 % Apply 1 application topically 2 (two) times daily as needed (for itching/irritation).   Not Taking    Review of Systems  Constitutional: Negative.   Gastrointestinal: Positive for abdominal pain. Negative for constipation, diarrhea, nausea and vomiting.  Genitourinary: Positive for vaginal discharge. Negative for dysuria and vaginal bleeding.  Musculoskeletal: Positive for back pain.  Psychiatric/Behavioral:       Denies drug use   Physical Exam   Blood pressure 111/58, pulse 96, temperature 98.3 F (36.8 C), resp. rate 20, height 5\' 2"  (1.575 m), weight 129 lb (58.5 kg), SpO2 98 %.  Physical Exam  Nursing note and vitals  reviewed. Constitutional: She is oriented to person, place, and time. Vital signs are normal. She appears well-nourished. No distress.  HENT:  Head: Normocephalic and atraumatic.  Mouth/Throat: Mucous membranes are dry.  Eyes: Conjunctivae are normal. Right eye exhibits no discharge. Left eye exhibits no discharge. No scleral icterus.  Neck: Normal range of motion.  Respiratory: Effort normal. No respiratory distress.  Genitourinary: Cervix exhibits no friability. No bleeding in the vagina. Vaginal discharge found.  Genitourinary Comments: No pooling. Small amount of thin white discharge.   Neurological: She is oriented to person, place, and time.  Skin: Skin is warm and dry. Lesion noted. She is not diaphoretic.  Multiple small open sores on patient's body.   Psychiatric: She has a normal mood and affect. Judgment and thought content normal. Her speech is slurred. She is slowed.   Fetal Tracing:  Baseline: 125 Variability: moderate Accelerations: 15x15 Decelerations: none  Toco: none MAU Course  Procedures Results for orders placed or performed during the hospital encounter of 09/09/16 (from the past 24 hour(s))  Urinalysis, Routine w reflex microscopic     Status: Abnormal   Collection Time: 09/09/16  8:10 PM  Result Value Ref Range   Color, Urine YELLOW YELLOW   APPearance CLEAR CLEAR   Specific Gravity, Urine 1.020 1.005 - 1.030   pH 6.5 5.0 - 8.0   Glucose, UA NEGATIVE NEGATIVE mg/dL   Hgb urine dipstick MODERATE (A) NEGATIVE   Bilirubin Urine NEGATIVE NEGATIVE   Ketones, ur NEGATIVE NEGATIVE mg/dL   Protein, ur NEGATIVE NEGATIVE mg/dL   Nitrite NEGATIVE NEGATIVE   Leukocytes, UA NEGATIVE NEGATIVE  Rapid urine drug screen (hospital performed)     Status: Abnormal   Collection Time: 09/09/16  8:10 PM  Result Value Ref Range   Opiates POSITIVE (A) NONE DETECTED   Cocaine NONE DETECTED NONE DETECTED   Benzodiazepines POSITIVE (A) NONE DETECTED   Amphetamines NONE  DETECTED NONE DETECTED   Tetrahydrocannabinol NONE DETECTED NONE DETECTED   Barbiturates NONE DETECTED NONE DETECTED  Urinalysis, Microscopic (reflex)     Status: Abnormal   Collection Time: 09/09/16  8:10 PM  Result Value Ref Range   RBC / HPF TOO NUMEROUS TO COUNT 0 - 5 RBC/hpf   WBC, UA 6-30 0 - 5 WBC/hpf   Bacteria, UA FEW (A) NONE SEEN   Squamous Epithelial / LPF 0-5 (A) NONE SEEN  Wet prep, genital     Status: Abnormal   Collection Time: 09/09/16  8:31 PM  Result Value Ref Range   Yeast Wet Prep HPF POC NONE SEEN NONE SEEN   Trich, Wet Prep NONE SEEN NONE SEEN   Clue Cells Wet Prep HPF POC PRESENT (A)  NONE SEEN   WBC, Wet Prep HPF POC FEW (A) NONE SEEN   Sperm NONE SEEN   POCT fern test     Status: None   Collection Time: 09/09/16  8:35 PM  Result Value Ref Range   POCT Fern Test Negative = intact amniotic membranes   Amnisure rupture of membrane (rom)not at [facility]     Status: None   Collection Time: 09/09/16  8:47 PM  Result Value Ref Range   Amnisure ROM NEGATIVE     MDM Category 1 fetal tracing; no ctx on toco U/a & UDS collected No pooling & fern negative -- amnisure ordered UDS positive for benzos & opiates -- pt does not have rx for opiates  Amnisure, u/a, & wet prep pending Care [facility].  Judeth Horn, NP 09/09/2016 9:02 PM   Dorathy Kinsman, CNM assumed care of pt. Amnisure negative. Cervix Closed and long. Wet pos BV. Will Tx w/ Flagyl. No evidence of ROM or preterm labor.  Pt is clearly under the influence of drugs, but has a known significant Hx polysubstance abuse. Pt is drowsy, but arousable, Oriented x 4. Sister is at [facility] and confirms that this is normal for the pt. Sister will drive the pt home and takes her to appointments. [facility]. Pt had growth [place] for IUGR, BPP 8/8 w/ AFI 11 cm today in the office and has NST scheduled 09/12/16. Discussed Hx, labs, exam w/ Dr. Macon Large. Agrees w/ POC. New orders: None.        Assessment and Plan   1. No leakage of amniotic fluid into vagina   2. Vaginal discharge during pregnancy in third trimester   3. BV (bacterial vaginosis)   4. Intrauterine growth restriction affecting antepartum care of mother in third trimester, single or unspecified fetus    D/C home in stable condition per consult w/ Dr. Macon Large.  PTL precautions and FKC's.  Follow-up Information    [facility] Follow up on 09/12/2016.   Specialty:  Obstetrics and Gynecology Why:  as scheduled or sooner as needed if symptoms worsen Contact information: [address] [place] [address] [phone]       [facility] MATERNITY ADMISSIONS Follow up.   Why:  in emergencies Contact information: [address] [id number] mc [place] [address] [phone]         Allergies as of 09/09/2016      Reactions   Biaxin [clarithromycin] Anaphylaxis   Vicodin [hydrocodone-acetaminophen] Itching, Other (See Comments)   Pt states that it makes her face hot.        Medication List    TAKE these medications   ADDERALL 15 MG tablet Generic drug:  amphetamine-dextroamphetamine Take 15 mg by mouth daily.   ALPRAZolam 1 MG tablet Commonly known as:  XANAX Take 1 mg by mouth 2 (two) times daily.   cephALEXin 500 MG capsule Commonly known as:  KEFLEX Take 1 capsule (500 mg total) by mouth 3 (three) times daily.   metroNIDAZOLE 500 MG tablet Commonly known as:  FLAGYL Take 1 tablet (500 mg total) by mouth 2 (two) times daily.   prenatal vitamin w/FE, FA 27-1 MG Tabs tablet Take 1 tablet by mouth daily at 12 noon.   triamcinolone cream 0.1 % Commonly known as:  KENALOG Apply 1 application topically 2 (two) times daily as needed (for itching/irritation).      [place], [place] 09/09/2016 9:50  PM

## 2016-09-09 NOTE — Discharge Instructions (Signed)
Bacterial Vaginosis °Bacterial vaginosis is a vaginal infection that occurs when the normal balance of bacteria in the vagina is disrupted. It results from an overgrowth of certain bacteria. This is the most common vaginal infection among women ages 15-44. °Because bacterial vaginosis increases your risk for STIs (sexually transmitted infections), getting treated can help reduce your risk for chlamydia, gonorrhea, herpes, and HIV (human immunodeficiency virus). Treatment is also important for preventing complications in pregnant women, because this condition can cause an early (premature) delivery. °What are the causes? °This condition is caused by an increase in harmful bacteria that are normally present in small amounts in the vagina. However, the reason that the condition develops is not fully understood. °What increases the risk? °The following factors may make you more likely to develop this condition: °· Having a new sexual partner or multiple sexual partners. °· Having unprotected sex. °· Douching. °· Having an intrauterine device (IUD). °· Smoking. °· Drug and alcohol abuse. °· Taking certain antibiotic medicines. °· Being pregnant. °You cannot get bacterial vaginosis from toilet seats, bedding, swimming pools, or contact with objects around you. °What are the signs or symptoms? °Symptoms of this condition include: °· Grey or white vaginal discharge. The discharge can also be watery or foamy. °· A fish-like odor with discharge, especially after sexual intercourse or during menstruation. °· Itching in and around the vagina. °· Burning or pain with urination. °Some women with bacterial vaginosis have no signs or symptoms. °How is this diagnosed? °This condition is diagnosed based on: °· Your medical history. °· A physical exam of the vagina. °· Testing a sample of vaginal fluid under a microscope to look for a large amount of bad bacteria or abnormal cells. Your health care provider may use a cotton swab or a  small wooden spatula to collect the sample. °How is this treated? °This condition is treated with antibiotics. These may be given as a pill, a vaginal cream, or a medicine that is put into the vagina (suppository). If the condition comes back after treatment, a second round of antibiotics may be needed. °Follow these instructions at home: °Medicines  °· Take over-the-counter and prescription medicines only as told by your health care provider. °· Take or use your antibiotic as told by your health care provider. Do not stop taking or using the antibiotic even if you start to feel better. °General instructions  °· If you have a female sexual partner, tell her that you have a vaginal infection. She should see her health care provider and be treated if she has symptoms. If you have a female sexual partner, he does not need treatment. °· During treatment: °¨ Avoid sexual activity until you finish treatment. °¨ Do not douche. °¨ Avoid alcohol as directed by your health care provider. °¨ Avoid breastfeeding as directed by your health care provider. °· Drink enough water and fluids to keep your urine clear or pale yellow. °· Keep the area around your vagina and rectum clean. °¨ Wash the area daily with warm water. °¨ Wipe yourself from front to back after using the toilet. °· Keep all follow-up visits as told by your health care provider. This is important. °How is this prevented? °· Do not douche. °· Wash the outside of your vagina with warm water only. °· Use protection when having sex. This includes latex condoms and dental dams. °· Limit how many sexual partners you have. To help prevent bacterial vaginosis, it is best to have sex with just one   partner (monogamous).  Make sure you and your sexual partner are tested for STIs.  Wear cotton or cotton-lined underwear.  Avoid wearing tight pants and pantyhose, especially during summer.  Limit the amount of alcohol that you drink.  Do not use any products that contain  nicotine or tobacco, such as cigarettes and e-cigarettes. If you need help quitting, ask your health care provider.  Do not use illegal drugs. Where to find more information:  Centers for Disease Control and Prevention: SolutionApps.co.za  American Sexual Health Association (ASHA): www.ashastd.org  U.S. Department of Health and Health and safety inspector, Office on Women's Health: ConventionalMedicines.si or http://www.anderson-williamson.info/ Contact a health care provider if:  Your symptoms do not improve, even after treatment.  You have more discharge or pain when urinating.  You have a fever.  You have pain in your abdomen.  You have pain during sex.  You have vaginal bleeding between periods. Summary  Bacterial vaginosis is a vaginal infection that occurs when the normal balance of bacteria in the vagina is disrupted.  Because bacterial vaginosis increases your risk for STIs (sexually transmitted infections), getting treated can help reduce your risk for chlamydia, gonorrhea, herpes, and HIV (human immunodeficiency virus). Treatment is also important for preventing complications in pregnant women, because the condition can cause an early (premature) delivery.  This condition is treated with antibiotic medicines. These may be given as a pill, a vaginal cream, or a medicine that is put into the vagina (suppository). This information is not intended to replace advice given to you by your health care provider. Make sure you discuss any questions you have with your health care provider. Document Released: 06/23/2005 Document Revised: 03/08/2016 Document Reviewed: 03/08/2016 Elsevier Interactive Patient Education  2017 Elsevier Inc.   [employer] of the uterus can occur throughout pregnancy, but they are not always a sign that you are in labor. You may have practice contractions called Braxton Hicks contractions. These false labor contractions are  sometimes confused with true labor. What are Deberah Pelton contractions? Braxton Hicks contractions are tightening movements that occur in the muscles of the uterus before labor. Unlike true labor contractions, these contractions do not result in opening (dilation) and thinning of the cervix. Toward the end of pregnancy (32-34 weeks), Braxton Hicks contractions can happen more often and may become stronger. These contractions are sometimes difficult to tell apart from true labor because they can be very uncomfortable. You should not feel embarrassed if you go to the hospital with false labor. Sometimes, the only way to tell if you are in true labor is for your health care provider to look for changes in the cervix. The health care provider will do a physical exam and may monitor your contractions. If you are not in true labor, the exam should show that your cervix is not dilating and your water has not broken. If there are no prenatal problems or other health problems associated with your pregnancy, it is completely safe for you to be sent home with false labor. You may continue to have Braxton Hicks contractions until you go into true labor. How can I tell the difference between true labor and false labor?  Differences  False labor  Contractions last 30-70 seconds.: Contractions are usually shorter and not as strong as true labor contractions.  Contractions become very regular.: Contractions are usually irregular.  Discomfort is usually felt in the top of the uterus, and it spreads to the lower abdomen and low back.: Contractions are often  felt in the front of the lower abdomen and in the groin.  Contractions do not go away with walking.: Contractions may go away when you walk around or change positions while lying down.  Contractions usually become more intense and increase in frequency.: Contractions get weaker and are shorter-lasting as time goes on.  The cervix dilates and gets thinner.:  The cervix usually does not dilate or become thin. Follow these instructions at home:  Take over-the-counter and prescription medicines only as told by your health care provider.  Keep up with your usual exercises and follow other instructions from your health care provider.  Eat and drink lightly if you think you are going into labor.  If Braxton Hicks contractions are making you uncomfortable:  Change your position from lying down or resting to walking, or change from walking to resting.  Sit and rest in a tub of warm water.  Drink enough fluid to keep your urine clear or pale yellow. Dehydration may cause these contractions.  Do slow and deep breathing several times an hour.  Keep all follow-up prenatal visits as told by your health care provider. This is important. Contact a health care provider if:  You have a fever.  You have continuous pain in your abdomen. Get help right away if:  Your contractions become stronger, more regular, and closer together.  You have fluid leaking or gushing from your vagina.  You pass blood-tinged mucus (bloody show).  You have bleeding from your vagina.  You have low back pain that you never had before.  You feel your babys head pushing down and causing pelvic pressure.  Your baby is not moving inside you as much as it used to. Summary  Contractions that occur before labor are called Braxton Hicks contractions, false labor, or practice contractions.  Braxton Hicks contractions are usually shorter, weaker, farther apart, and less regular than true labor contractions. True labor contractions usually become progressively stronger and regular and they become more frequent.  Manage discomfort from [facility] Hicks contractions by changing position, resting in a warm bath, drinking plenty of water, or practicing deep breathing. This information is not intended to replace advice given to you by your health care provider. Make sure you discuss  any questions you have with your health care provider. Document Released: 06/23/2005 Document Revised: 05/12/2016 Document Reviewed: 05/12/2016 Elsevier Interactive Patient Education  2017 [employer]

## 2016-09-09 NOTE — MAU Note (Signed)
Pt using bedside commode

## 2016-09-10 LAB — GC/CHLAMYDIA PROBE AMP (~~LOC~~) NOT AT ARMC
CHLAMYDIA, DNA PROBE: NEGATIVE
NEISSERIA GONORRHEA: NEGATIVE

## 2016-09-12 ENCOUNTER — Ambulatory Visit (INDEPENDENT_AMBULATORY_CARE_PROVIDER_SITE_OTHER): Payer: Medicaid Other | Admitting: Obstetrics & Gynecology

## 2016-09-12 ENCOUNTER — Other Ambulatory Visit: Payer: Medicaid Other

## 2016-09-12 VITALS — BP 100/60 | HR 100 | Wt 136.0 lb

## 2016-09-12 DIAGNOSIS — Z1389 Encounter for screening for other disorder: Secondary | ICD-10-CM

## 2016-09-12 DIAGNOSIS — Z3A35 35 weeks gestation of pregnancy: Secondary | ICD-10-CM

## 2016-09-12 DIAGNOSIS — O99323 Drug use complicating pregnancy, third trimester: Secondary | ICD-10-CM | POA: Diagnosis not present

## 2016-09-12 DIAGNOSIS — O0993 Supervision of high risk pregnancy, unspecified, third trimester: Secondary | ICD-10-CM

## 2016-09-12 DIAGNOSIS — O365930 Maternal care for other known or suspected poor fetal growth, third trimester, not applicable or unspecified: Secondary | ICD-10-CM | POA: Diagnosis not present

## 2016-09-12 DIAGNOSIS — O26843 Uterine size-date discrepancy, third trimester: Secondary | ICD-10-CM

## 2016-09-12 DIAGNOSIS — O0990 Supervision of high risk pregnancy, unspecified, unspecified trimester: Secondary | ICD-10-CM

## 2016-09-12 DIAGNOSIS — Z331 Pregnant state, incidental: Secondary | ICD-10-CM | POA: Diagnosis not present

## 2016-09-12 DIAGNOSIS — Z131 Encounter for screening for diabetes mellitus: Secondary | ICD-10-CM

## 2016-09-12 LAB — POCT URINALYSIS DIPSTICK
Blood, UA: NEGATIVE
GLUCOSE UA: NEGATIVE
Ketones, UA: NEGATIVE
Leukocytes, UA: NEGATIVE
NITRITE UA: NEGATIVE
Protein, UA: NEGATIVE

## 2016-09-12 MED ORDER — BETAMETHASONE SOD PHOS & ACET 6 (3-3) MG/ML IJ SUSP
12.0000 mg | Freq: Once | INTRAMUSCULAR | Status: AC
  Administered 2016-09-12: 12 mg via INTRAMUSCULAR

## 2016-09-12 NOTE — Progress Notes (Signed)
Fetal Surveillance Testing today:  Reactive NST   High Risk Pregnancy Diagnosis(es):   FGR, Chronic benzo use, illicit opiate use, poor follow up with prenatal care  [id number] 3032w5d Estimated Date of Delivery: 10/19/16  Blood pressure 100/60, pulse 100, weight 136 lb (61.7 kg).  Urinalysis: Negative   HPI: The patient is being seen today for ongoing management of as above. Today she reports denies any current opiate use, denies snorting any drugs   BP weight and urine results all reviewed and noted. Patient reports good fetal movement, denies any bleeding and no rupture of membranes symptoms or regular contractions.  Fundal Height:  32 Fetal Heart rate:  140 Edema:  none  Patient is without complaints other than noted in her HPI. All questions were answered.  All lab and sonogram results have been reviewed. Comments:    Assessment:  1.  Pregnancy at 1832w5d,  Estimated Date of Delivery: 10/19/16 :                          2.  FGR                        3.  Polysubstance use and abuse  Medication(s) Plans:    Treatment Plan:  Twice weekly surveillance, sonogram alternating with NST, Weekly Dopplers, delivery 37 weeks comtemplating TOLAC option  Return in about 4 days (around 09/16/2016) for BPP/sono, HROB, with Dr Despina HiddenEure. for appointment for high risk OB care  Meds ordered this encounter  Medications  . betamethasone acetate-betamethasone sodium phosphate (CELESTONE) injection 12 mg   Orders Placed This Encounter  Procedures  . US Fetal BPP W/O Non Stress  . US UA Cord Doppler  . POCT urinalysis dipstick

## 2016-09-12 NOTE — Progress Notes (Signed)
Pt is due for 2nd Betamethasone 12 mg in 24 hours. Per Dr. Despina HiddenEure: draw up shot and give to pt. Someone in family will give shot tomorrow. I drew up Betamethasone 12 [facility] 0517-[facility]20-[facility] Lot # [id number] exp 03/2018 to be given in left gluteal. Pt voiced understanding. JSY

## 2016-09-13 LAB — GLUCOSE TOLERANCE, 2 HOURS W/ 1HR
GLUCOSE, 2 HOUR: 102 mg/dL (ref 65–152)
Glucose, 1 hour: 117 mg/dL (ref 65–179)
Glucose, Fasting: 80 mg/dL (ref 65–91)

## 2016-09-13 LAB — HIV ANTIBODY (ROUTINE TESTING W REFLEX): HIV Screen 4th Generation wRfx: NONREACTIVE

## 2016-09-13 LAB — CBC
HEMATOCRIT: 35.6 % (ref 34.0–46.6)
Hemoglobin: 11.9 g/dL (ref 11.1–15.9)
MCH: 29.2 pg (ref 26.6–33.0)
MCHC: 33.4 g/dL (ref 31.5–35.7)
MCV: 88 fL (ref 79–97)
PLATELETS: 271 10*3/uL (ref 150–379)
RBC: 4.07 x10E6/uL (ref 3.77–5.28)
RDW: 14.8 % (ref 12.3–15.4)
WBC: 11.6 10*3/uL — AB (ref 3.4–10.8)

## 2016-09-13 LAB — RPR: RPR Ser Ql: NONREACTIVE

## 2016-09-13 LAB — ANTIBODY SCREEN: Antibody Screen: NEGATIVE

## 2016-09-16 ENCOUNTER — Ambulatory Visit (INDEPENDENT_AMBULATORY_CARE_PROVIDER_SITE_OTHER): Payer: Medicaid Other

## 2016-09-16 ENCOUNTER — Encounter: Payer: Self-pay | Admitting: Obstetrics & Gynecology

## 2016-09-16 ENCOUNTER — Ambulatory Visit (INDEPENDENT_AMBULATORY_CARE_PROVIDER_SITE_OTHER): Payer: Medicaid Other | Admitting: Obstetrics & Gynecology

## 2016-09-16 VITALS — BP 100/60 | HR 74 | Wt 138.0 lb

## 2016-09-16 DIAGNOSIS — O0993 Supervision of high risk pregnancy, unspecified, third trimester: Secondary | ICD-10-CM | POA: Diagnosis not present

## 2016-09-16 DIAGNOSIS — O0990 Supervision of high risk pregnancy, unspecified, unspecified trimester: Secondary | ICD-10-CM

## 2016-09-16 DIAGNOSIS — Z3A35 35 weeks gestation of pregnancy: Secondary | ICD-10-CM

## 2016-09-16 DIAGNOSIS — O365930 Maternal care for other known or suspected poor fetal growth, third trimester, not applicable or unspecified: Secondary | ICD-10-CM

## 2016-09-16 DIAGNOSIS — Z1389 Encounter for screening for other disorder: Secondary | ICD-10-CM

## 2016-09-16 DIAGNOSIS — Z331 Pregnant state, incidental: Secondary | ICD-10-CM | POA: Diagnosis not present

## 2016-09-16 DIAGNOSIS — O99323 Drug use complicating pregnancy, third trimester: Secondary | ICD-10-CM | POA: Diagnosis not present

## 2016-09-16 DIAGNOSIS — Z8759 Personal history of other complications of pregnancy, childbirth and the puerperium: Secondary | ICD-10-CM

## 2016-09-16 LAB — POCT URINALYSIS DIPSTICK
Blood, UA: NEGATIVE
Glucose, UA: NEGATIVE
KETONES UA: NEGATIVE
Leukocytes, UA: NEGATIVE
Nitrite, UA: NEGATIVE

## 2016-09-16 NOTE — Addendum Note (Signed)
Addended by: Federico FlakeNES, Willard Madrigal A on: 09/16/2016 05:53 PM   Modules accepted: Orders

## 2016-09-16 NOTE — Progress Notes (Signed)
US 35+2 wks,cephalic,BPP 6/8 (no breathing),FHR 157 BPM,post pl gr 1,afi 9.8 cm,RI .79,.71,97%

## 2016-09-16 NOTE — Progress Notes (Signed)
Fetal Surveillance Testing today:  BPP 6/8 (late in the day, can't come back tomorrow for NST) with Doppler flow 97% with still good Doppler flow   High Risk Pregnancy Diagnosis(es):   FGR with elevated Doppler ratios  [id number] 6445w2d Estimated Date of Delivery: 10/19/16  There were no vitals taken for this visit.  Urinalysis: Negative   HPI: The patient is being seen today for ongoing management of as FGR with elevated Doppler ratios, . Today she reports no complaints   BP weight and urine results all reviewed and noted. Patient reports good fetal movement, denies any bleeding and no rupture of membranes symptoms or regular contractions.  Fundal Height:  32 Fetal Heart rate:  135 Edema:  none  Patient is without complaints other than noted in her HPI. All questions were answered.  All lab and sonogram results have been reviewed. Comments:    Assessment:  1.  Pregnancy at 1445w2d,  Estimated Date of Delivery: 10/19/16 :                          2.  FGR with elevated Doppler flow ratios                        3.  Multi drug use  Medication(s) Plans:    Treatment Plan:  Twice weekly testing, delivery at 37 weeks if not clinically indicated otherwise, pt is unable to come back until Friday, baby was very active just did not have sustained breathing for 30 sec, several small bouts of breathing,  Return in about 3 days (around 09/19/2016) for NST, HROB. for appointment for high risk OB care  No orders of the defined types were placed in this encounter.  No orders of the defined types were placed in this encounter.

## 2016-09-19 ENCOUNTER — Ambulatory Visit (INDEPENDENT_AMBULATORY_CARE_PROVIDER_SITE_OTHER): Payer: Medicaid Other | Admitting: Women's Health

## 2016-09-19 ENCOUNTER — Encounter: Payer: Self-pay | Admitting: Women's Health

## 2016-09-19 VITALS — BP 94/60 | HR 98 | Wt 136.5 lb

## 2016-09-19 DIAGNOSIS — Z331 Pregnant state, incidental: Secondary | ICD-10-CM | POA: Diagnosis not present

## 2016-09-19 DIAGNOSIS — O365930 Maternal care for other known or suspected poor fetal growth, third trimester, not applicable or unspecified: Secondary | ICD-10-CM

## 2016-09-19 DIAGNOSIS — Z1389 Encounter for screening for other disorder: Secondary | ICD-10-CM | POA: Diagnosis not present

## 2016-09-19 DIAGNOSIS — Z3A36 36 weeks gestation of pregnancy: Secondary | ICD-10-CM | POA: Diagnosis not present

## 2016-09-19 DIAGNOSIS — O0993 Supervision of high risk pregnancy, unspecified, third trimester: Secondary | ICD-10-CM | POA: Diagnosis not present

## 2016-09-19 DIAGNOSIS — O0990 Supervision of high risk pregnancy, unspecified, unspecified trimester: Secondary | ICD-10-CM

## 2016-09-19 DIAGNOSIS — O99323 Drug use complicating pregnancy, third trimester: Secondary | ICD-10-CM | POA: Diagnosis not present

## 2016-09-19 LAB — POCT URINALYSIS DIPSTICK
GLUCOSE UA: NEGATIVE
Glucose, UA: NEGATIVE
Ketones, UA: NEGATIVE
Leukocytes, UA: NEGATIVE
Nitrite, UA: NEGATIVE
PROTEIN UA: NEGATIVE
Protein, UA: NEGATIVE
RBC UA: NEGATIVE

## 2016-09-19 NOTE — Progress Notes (Signed)
High Risk Pregnancy Diagnosis(es): IUGR, Xanax & Adderall use [id number] 6161w5d Estimated Date of Delivery: 10/19/16 BP 94/60   Pulse 98   Wt 136 lb 8 oz (61.9 kg)   LMP  (LMP Unknown)   BMI 24.97 kg/m   Urinalysis: Negative HPI:  Doing well, no complaints BP, weight, and urine reviewed.  Reports good fm. Denies regular uc's, lof, vb, uti s/s.  Still deciding b/w TOLAC and RCS, states she wants to wait until u/s next week to decide  Fundal Height:  29 Fetal Heart rate:  135, reactive NST Edema:  none  Reviewed ptl s/s, fkc All questions were answered Assessment: 261w5d IUGR, Xanax & Adderall use Medication(s) Plans: no rx's from us Treatment Plan:  2x/wk testing nst alt w/ bpp/dopp, deliver @ 37wks- still deciding b/w TOLAC & RCS Follow up as scheduled for high-risk OB appt and bpp/dopp u/s

## 2016-09-19 NOTE — Patient Instructions (Signed)
Call the office ([phone]) or go to [facility] if:  You begin to have strong, frequent contractions  Your water breaks.  Sometimes it is a big gush of fluid, sometimes it is just a trickle that keeps getting your panties wet or running down your legs  You have vaginal bleeding.  It is normal to have a small amount of spotting if your cervix was checked.   You don't feel your baby moving like normal.  If you don't, get you something to eat and drink and lay down and focus on feeling your baby move.  You should feel at least 10 movements in 2 hours.  If you don't, you should call the office or go to [facility].     Preterm Labor and Birth Information The normal length of a pregnancy is 39-41 weeks. Preterm labor is when labor starts before 37 completed weeks of pregnancy. What are the risk factors for preterm labor? Preterm labor is more likely to occur in women who:  Have certain infections during pregnancy such as a bladder infection, sexually transmitted infection, or infection inside the uterus (chorioamnionitis).  Have a shorter-than-normal cervix.  Have gone into preterm labor before.  Have had surgery on their cervix.  Are younger than age 17 or older than age 35.  Are African American.  Are pregnant with twins or multiple babies (multiple gestation).  Take street drugs or smoke while pregnant.  Do not gain enough weight while pregnant.  Became pregnant shortly after having been pregnant. What are the symptoms of preterm labor? Symptoms of preterm labor include:  Cramps similar to those that can happen during a menstrual period. The cramps may happen with diarrhea.  Pain in the abdomen or lower back.  Regular uterine contractions that may feel like tightening of the abdomen.  A feeling of increased pressure in the pelvis.  Increased watery or bloody mucus discharge from the vagina.  Water breaking (ruptured amniotic sac). Why is it important to  recognize signs of preterm labor? It is important to recognize signs of preterm labor because babies who are born prematurely may not be fully developed. This can put them at an increased risk for:  Long-term (chronic) heart and lung problems.  Difficulty immediately after birth with regulating body systems, including blood sugar, body temperature, heart rate, and breathing rate.  Bleeding in the brain.  Cerebral palsy.  Learning difficulties.  Death. These risks are highest for babies who are born before 34 weeks of pregnancy. How is preterm labor treated? Treatment depends on the length of your pregnancy, your condition, and the health of your baby. It may involve:  Having a stitch (suture) placed in your cervix to prevent your cervix from opening too early (cerclage).  Taking or being given medicines, such as:  Hormone medicines. These may be given early in pregnancy to help support the pregnancy.  Medicine to stop contractions.  Medicines to help mature the baby's lungs. These may be prescribed if the risk of delivery is high.  Medicines to prevent your baby from developing cerebral palsy. If the labor happens before 34 weeks of pregnancy, you may need to stay in the hospital. What should I do if I think I am in preterm labor? If you think that you are going into preterm labor, call your health care provider right away. How can I prevent preterm labor in future pregnancies? To increase your chance of having a full-term pregnancy:  Do not use any tobacco products, such   as cigarettes, chewing tobacco, and e-cigarettes. If you need help quitting, ask your health care provider.  Do not use street drugs or medicines that have not been prescribed to you during your pregnancy.  Talk with your health care provider before taking any herbal supplements, even if you have been taking them regularly.  Make sure you gain a healthy amount of weight during your pregnancy.  Watch for  infection. If you think that you might have an infection, get it checked right away.  Make sure to tell your health care provider if you have gone into preterm labor before. This information is not intended to replace advice given to you by your health care provider. Make sure you discuss any questions you have with your health care provider. Document Released: 09/13/2003 Document Revised: 12/04/2015 Document Reviewed: 11/14/2015 Elsevier Interactive Patient Education  2017 [employer]  

## 2016-09-20 LAB — PMP SCREEN PROFILE (10S), URINE
AMPHETAMINE SCRN UR: NEGATIVE ng/mL
BENZODIAZEPINE SCREEN, URINE: POSITIVE ng/mL
Barbiturate Screen, Ur: NEGATIVE ng/mL
CANNABINOIDS UR QL SCN: NEGATIVE ng/mL
COCAINE(METAB.) SCREEN, URINE: NEGATIVE ng/mL
Creatinine(Crt), U: 45.7 mg/dL (ref 20.0–300.0)
Methadone Scn, Ur: NEGATIVE ng/mL
OPIATE SCRN UR: NEGATIVE ng/mL
OXYCODONE+OXYMORPHONE UR QL SCN: POSITIVE ng/mL
PCP SCRN UR: NEGATIVE ng/mL
Ph of Urine: 7.3 (ref 4.5–8.9)
Propoxyphene, Screen: NEGATIVE ng/mL

## 2016-09-21 LAB — URINE CULTURE: Organism ID, Bacteria: NO GROWTH

## 2016-09-24 ENCOUNTER — Ambulatory Visit (INDEPENDENT_AMBULATORY_CARE_PROVIDER_SITE_OTHER): Payer: Medicaid Other

## 2016-09-24 ENCOUNTER — Ambulatory Visit (INDEPENDENT_AMBULATORY_CARE_PROVIDER_SITE_OTHER): Payer: Medicaid Other | Admitting: Obstetrics and Gynecology

## 2016-09-24 ENCOUNTER — Other Ambulatory Visit: Payer: Self-pay | Admitting: Obstetrics and Gynecology

## 2016-09-24 ENCOUNTER — Encounter: Payer: Self-pay | Admitting: Obstetrics and Gynecology

## 2016-09-24 VITALS — BP 110/64 | HR 102 | Wt 136.4 lb

## 2016-09-24 DIAGNOSIS — Z8759 Personal history of other complications of pregnancy, childbirth and the puerperium: Secondary | ICD-10-CM

## 2016-09-24 DIAGNOSIS — Z331 Pregnant state, incidental: Secondary | ICD-10-CM

## 2016-09-24 DIAGNOSIS — Z1389 Encounter for screening for other disorder: Secondary | ICD-10-CM

## 2016-09-24 DIAGNOSIS — O365930 Maternal care for other known or suspected poor fetal growth, third trimester, not applicable or unspecified: Secondary | ICD-10-CM | POA: Diagnosis not present

## 2016-09-24 DIAGNOSIS — O0993 Supervision of high risk pregnancy, unspecified, third trimester: Secondary | ICD-10-CM | POA: Diagnosis not present

## 2016-09-24 DIAGNOSIS — Z3A36 36 weeks gestation of pregnancy: Secondary | ICD-10-CM

## 2016-09-24 DIAGNOSIS — Z3483 Encounter for supervision of other normal pregnancy, third trimester: Secondary | ICD-10-CM

## 2016-09-24 DIAGNOSIS — O99323 Drug use complicating pregnancy, third trimester: Secondary | ICD-10-CM | POA: Diagnosis not present

## 2016-09-24 DIAGNOSIS — O0990 Supervision of high risk pregnancy, unspecified, unspecified trimester: Secondary | ICD-10-CM

## 2016-09-24 DIAGNOSIS — Z98891 History of uterine scar from previous surgery: Secondary | ICD-10-CM

## 2016-09-24 LAB — POCT URINALYSIS DIPSTICK
Blood, UA: NEGATIVE
Glucose, UA: NEGATIVE
KETONES UA: NEGATIVE
Leukocytes, UA: NEGATIVE
Nitrite, UA: NEGATIVE
PROTEIN UA: NEGATIVE

## 2016-09-24 NOTE — Progress Notes (Signed)
Fetal Surveillance Testing today:  Fetal BPP   High Risk Pregnancy Diagnosis(es):  IUGR, Xanax & Adderall use, prior c/s x 1 now declining tolac  [id number] 6781w3d Estimated Date of Delivery: 10/19/16  Blood pressure 110/64, pulse (!) 102, weight 136 lb 6.4 oz (61.9 kg).  Urinalysis: Negative   HPI: The patient is being seen today for ongoing management of above. Today she has no acute complaints at this time. Pt feels she is still growing abdominal size over the last 2 wk.   BP weight and urine results all reviewed and noted. Patient reports good fetal movement, denies any bleeding and no rupture of membranes symptoms or regular contractions.  Fundal Height:  31 cm Fetal Heart rate: 133 Pelvic exam:  VULVA: normal appearing vulva with no masses, tenderness or lesions,  VAGINA: normal appearing vagina with normal color and discharge, no lesions,  CERVIX: 1 cm 20% minus 2  Patient is without complaints other than noted in her HPI. Discussed C-section vs Vaginal delivery at length ; pt would like to schedule a C-section. Also discussed tubal ligation vs IUD.,pt wants to delay tubal til postpartum, she has NOT signed tubal papers, and LEFT after appt without signing papers for MA that were discussed during visit.  All questions were answered.  All lab and sonogram results have been reviewed.  Assessment:  1.  Pregnancy at 3281w3d,  Estimated Date of Delivery: 10/19/16, BPP 8/8, with elevated dopplers, but still with good EDF.  Medication(s) Plans:  none  Treatment Plan: Schedule C-section for 09/28/2016 9:30                            GBS GC/Chl collected By signing my name below, I, Freida Busmaniana Omoyeni, attest that this documentation has been prepared under the direction and in the presence of Tilda BurrowJohn V Shloma Roggenkamp, MD . Electronically Signed: Freida Busmaniana Omoyeni, Scribe. 09/24/2016. 10:48 AM. I personally performed the services described in this documentation, which was SCRIBED in my presence. The recorded  information has been reviewed and considered accurate. It has been edited as necessary during review. Tilda BurrowFERGUSON,Olney Monier V, MD

## 2016-09-24 NOTE — H&P (Signed)
Carmen Martin is a 21 y.o. female [phone], AT [redacted]w[redacted]d ON 3/25 IS admitted for repeat cesarean section, at 30 0wk due to IUGR infant <10%ile, with doppler studies and NST's alternating biweekly when the pt would keep appts, since 32 wks. Doppler studies have shown progressively increasing SD ratios, and on 3.21 the bpp is 8/8 with a very active fetus, and s/d ratio has reached 99%ile for gest age, averaging 4.9 (4.0 and 6.1)  Pt has a prior delivery for c/s at 32 wk for non reassuring fetal testing as well as a prior svd of a preterm stillbirth. The pt has been counseled over the pro and con of TOLAC, and declines the option. She now expresses a desire for tubal ligation , but has never discussed this before today with providers, and has NOT signed tubal papers.  She will be referred for consent on tubal papers, and will wait until at least 6 weeks pp to consider btl.  . OB History    Gravida Para Term Preterm AB Living   4 2 0 2 1 1    SAB TAB Ectopic Multiple Live Births   1 0 0 0 1     Past Medical History:  Diagnosis Date  . ADHD (attention deficit hyperactivity disorder) 11/02/2012  . Anxiety   . Depression   . Eczema   . Headache(784.0)   . Hx MRSA infection    in buttocks  . Narcotic drug use 2015  . Substance abuse    Past Surgical History:  Procedure Laterality Date  . CESAREAN SECTION N/A 07/08/2014   Procedure: CESAREAN SECTION;  Surgeon: Tilda Burrow, MD;  Location: [facility];  Service: Obstetrics;  Laterality: N/A;  . COLONOSCOPY WITH PROPOFOL N/A 09/29/2012   Procedure: COLONOSCOPY WITH PROPOFOL;  Surgeon: Malissa Hippo, MD;  Location: [facility];  Service: Endoscopy;  Laterality: N/A;  at cecum at 0902 total withdrawal time  . WISDOM TOOTH EXTRACTION  2014   Family History: family history includes Alzheimer's disease in her paternal grandmother; Cancer in her maternal grandmother; Depression in her father; Diabetes in her mother; Heart disease in her mother;  Hypertension in her mother; Stroke in her mother and paternal grandfather. Social History:  reports that she has been smoking Cigarettes.  She has a 2.00 pack-year smoking history. She has never used smokeless tobacco. She reports that she does not drink alcohol or use drugs.     Maternal Diabetes: No Genetic Screening: Normal Maternal Ultrasounds/Referrals: Abnormal:  Findings:   IUGR Fetal Ultrasounds or other Referrals:  None Maternal Substance Abuse:  Yes:  Type: Other: adderAL THRU PREGNANCY ,AND XANAX BY PSYCHIATRY Significant Maternal Medications:  Meds include: Other:  ADDERAL, AND XANAX BY PSYCHIATRY Significant Maternal Lab Results:  Lab values include: Other: GBS GC CHL PENDING FROM 3/21 Other Comments:  None  ROS HAS GOOD SUPPORT FROM SISTER History   There were no vitals taken for this visit. Maternal Exam:  Abdomen: Fetal presentation: vertex  Introitus: Normal vulva. Normal vagina.  Ferning test: not done.  Nitrazine test: not done.  Pelvis: questionable for delivery.      Physical Exam  Prenatal labs: ABO, Rh: --/--/O POS (09/24 2019) Antibody: Negative (03/09 0850) Rubella:   RPR: Non Reactive (03/09 0850)  HBsAg:    HIV: Non Reactive (03/09 0850)  GBS:   PENDING Assessment/Plan: PREGNANCY 37.0 WK , A5W0981 IUGR INFANT,  PRIOR C/S DECLINING TOLAC PLAN: REPEAT CESAREAN ON 3/25 AT 9:15 AM   [name]  V 09/24/2016, 11:57 AM

## 2016-09-24 NOTE — Progress Notes (Signed)
US 36+3 wks,cephalic,BPP 8/8,post pl gr 2,normal ov's bilat,afi 16 cm,fhr 133 bpm,RI .84,.75 99%,S/D 6.1,4.0

## 2016-09-25 ENCOUNTER — Other Ambulatory Visit: Payer: Self-pay | Admitting: Obstetrics and Gynecology

## 2016-09-25 ENCOUNTER — Telehealth: Payer: Self-pay | Admitting: *Deleted

## 2016-09-25 NOTE — H&P (Signed)
Carmen Martin is a 21 y.o. female [phone] at [redacted]w[redacted]d presenting for repeat cesarean section. Cesarean is schedluled for 37.0 wk due to an IUGR infant, with doppler flow studies showing elevated doppler flow at the 99%ile for gestational age, with BPP 8/8 on ofice visit 3/21. Fetal movement has been good. The patient has declined IOL./TOLAC even after lengthy discussion of risks/benefits /alternatives.   The patient has had limited prenatal care, and has recently improved compliance as her sister has become involved in her life again. The patient expressed a desire for sterilization at the visit 3/21, but has not signed tubal ligation papers. The patient has been offered delayed interval tubal in the future, once fetal wellbeing is confirmed and Medicaid consents completed,  OB History    Gravida Para Term Preterm AB Living   4 2 0 2 1 1    SAB TAB Ectopic Multiple Live Births   1 0 0 0 1     Past Medical History:  Diagnosis Date  . ADHD (attention deficit hyperactivity disorder) 11/02/2012  . Anxiety   . Depression   . Eczema   . Headache(784.0)   . Hx MRSA infection    in buttocks  . Narcotic drug use 2015  . Substance abuse    Past Surgical History:  Procedure Laterality Date  . CESAREAN SECTION N/A 07/08/2014   Procedure: CESAREAN SECTION;  Surgeon: Tilda Burrow, MD;  Location: [facility];  Service: Obstetrics;  Laterality: N/A;  . COLONOSCOPY WITH PROPOFOL N/A 09/29/2012   Procedure: COLONOSCOPY WITH PROPOFOL;  Surgeon: Malissa Hippo, MD;  Location: [facility];  Service: Endoscopy;  Laterality: N/A;  at cecum at 0902 total withdrawal time  . WISDOM TOOTH EXTRACTION  2014   Family History: family history includes Alzheimer's disease in her paternal grandmother; Cancer in her maternal grandmother; Depression in her father; Diabetes in her mother; Heart disease in her mother; Hypertension in her mother; Stroke in her mother and paternal grandfather. Social History:  reports that she  has been smoking Cigarettes.  She has a 2.00 pack-year smoking history. She has never used smokeless tobacco. She reports that she does not drink alcohol or use drugs.     Maternal Diabetes: No Genetic Screening: Normal Maternal Ultrasounds/Referrals: Abnormal:  Findings:   IUGR Fetal Ultrasounds or other Referrals:  None Maternal Substance Abuse:  Yes:  Type: Prescription drugs, Other: on adderal thru out the pregnancy, and has been prescribed xanax by her psychiatrist. Significant Maternal Medications:  Meds include: Other: adderall, xanax Significant Maternal Lab Results:  None Other Comments:  None  Review of Systems  Constitutional: Negative.   Psychiatric/Behavioral: Negative.    History   There were no vitals taken for this visit. Maternal Exam:  Abdomen: Patient reports no abdominal tenderness. Surgical scars: low transverse.   Fundal height is 31.   Estimated fetal weight is 5 lb.   Fetal presentation: vertex 31 cm fundal height  Introitus: Normal vulva. Normal vagina.  Ferning test: not done.  Nitrazine test: not done.  Cervix: Cervix evaluated by sterile speculum exam and digital exam.     Physical Exam  Constitutional: She appears well-developed.  HENT:  Head: Normocephalic.  Eyes: Pupils are equal, round, and reactive to light.  Cardiovascular: Normal rate.   Respiratory: Effort normal.  GI:  Gravid 31 cm. EFW 4.5-5 lb  Skin: Skin is warm.   cervix closed gc/chl collected Prenatal labs: ABO, Rh: --/--/O POS (09/24 2019) Antibody: Negative (03/09 0850) Rubella:  RPR: Non Reactive (03/09 0850)  HBsAg:    HIV: Non Reactive (03/09 0850)  GBS:   pending , collected 3/21  Assessment/Plan: Pregnancy 37.0 wk Prior cesarean declining TOLAC IUGR infant Elevated Dopplers flow studies, RI 99%ile for GA without loss of EDF Plan:  Repeat cesarean section 09/28/16 at 9:15 am   Juventino Pavone V 09/25/2016, 1:44 PM

## 2016-09-25 NOTE — Pre-Procedure Instructions (Signed)
Phone call does not answer.  Unable to reach either emergency contact person.  I left a message for her Grandmother home number.  No other numbers were connected or had voicemails.  I sent an email to the patient's email address on file and left a message at [facility] in case the patient contacted them.

## 2016-09-25 NOTE — Telephone Encounter (Signed)
Sister called stated the patient is scheduled for surgery on 3/25 but has not had a call about her pre-op appointment. Sister states she is her transportation and is going to adopt the baby. Informed her [facility] would [facility] get [facility].   Left message on Amy Landon's VM regarding situation.   Spoke with Rainey(l&D charge) that I had not heard from Amy and wanted a call back before the end of the day.   Birdena CrandallRainey called back stating she spoke with Amy who states she has tried to call patient multiple times, but is unable to leave message. Informed Birdena CrandallRainey of sisters number and request to call. Birdena CrandallRainey stated Amy would call sister now.

## 2016-09-26 ENCOUNTER — Encounter (HOSPITAL_COMMUNITY): Payer: Self-pay

## 2016-09-26 ENCOUNTER — Encounter (HOSPITAL_COMMUNITY)
Admission: RE | Admit: 2016-09-26 | Discharge: 2016-09-26 | Disposition: A | Discharge status: Home / Self Care | Payer: Medicaid Other | Source: Ambulatory Visit | Attending: Obstetrics and Gynecology | Admitting: Obstetrics and Gynecology

## 2016-09-26 LAB — GC/CHLAMYDIA PROBE AMP
Chlamydia trachomatis, NAA: NEGATIVE
Neisseria gonorrhoeae by PCR: NEGATIVE

## 2016-09-26 LAB — CBC
HCT: 37.7 % (ref 36.0–46.0)
HEMOGLOBIN: 12.6 g/dL (ref 12.0–15.0)
MCH: 29.6 pg (ref 26.0–34.0)
MCHC: 33.4 g/dL (ref 30.0–36.0)
MCV: 88.5 fL (ref 78.0–100.0)
PLATELETS: 265 10*3/uL (ref 150–400)
RBC: 4.26 MIL/uL (ref 3.87–5.11)
RDW: 15.4 % (ref 11.5–15.5)
WBC: 11.5 10*3/uL — AB (ref 4.0–10.5)

## 2016-09-26 LAB — TYPE AND SCREEN
ABO/RH(D): O POS
Antibody Screen: NEGATIVE

## 2016-09-26 NOTE — Patient Instructions (Signed)
20 Berna BueKristin N Martin  09/26/2016   Your procedure is scheduled on:  09/08/2016  [facility].  Pick up the phone at the desk and dial [phone].   Call this number if you have problems the morning of surgery: [phone]   Remember:   Do not eat food:After Midnight.  Do not drink clear liquids: After Midnight.  Take these medicines the morning of surgery with A SIP OF WATER: do not take xanax or adderal on the day of surgery.  No medications on day of surgery   Do not wear jewelry, make-up or nail polish.  Do not wear lotions, powders, or perfumes. Do not wear deodorant.  Do not shave 48 hours prior to surgery.  Do not bring valuables to the hospital.  [facility] is not   responsible for any belongings or valuables brought to the hospital.  Contacts, dentures or bridgework may not be worn into surgery.  Leave suitcase in the car. After surgery it may be brought to your room.  For patients admitted to the hospital, checkout time is 11:00 AM the day of              discharge.   Patients discharged the day of surgery will not be allowed to drive             home.  Name and phone number of your driver: na  Special Instructions:   N/A   Please read over the following fact sheets that you were given:   Surgical Site Infection Prevention

## 2016-09-27 LAB — RPR: RPR Ser Ql: NONREACTIVE

## 2016-09-28 ENCOUNTER — Inpatient Hospital Stay (HOSPITAL_COMMUNITY): Payer: Medicaid Other | Admitting: Anesthesiology

## 2016-09-28 ENCOUNTER — Encounter (HOSPITAL_COMMUNITY): Payer: Self-pay | Admitting: *Deleted

## 2016-09-28 ENCOUNTER — Other Ambulatory Visit: Payer: Self-pay | Admitting: Certified Nurse Midwife

## 2016-09-28 ENCOUNTER — Encounter (HOSPITAL_COMMUNITY)
Admission: RE | Disposition: A | Discharge status: Home / Self Care | Payer: Self-pay | Source: Ambulatory Visit | Attending: Obstetrics and Gynecology

## 2016-09-28 ENCOUNTER — Inpatient Hospital Stay (HOSPITAL_COMMUNITY)
Admission: RE | Admit: 2016-09-28 | Discharge: 2016-10-01 | DRG: 766 | Disposition: A | Discharge status: Home / Self Care | Payer: Medicaid Other | Source: Ambulatory Visit | Attending: Obstetrics and Gynecology | Admitting: Obstetrics and Gynecology

## 2016-09-28 DIAGNOSIS — F909 Attention-deficit hyperactivity disorder, unspecified type: Secondary | ICD-10-CM | POA: Diagnosis present

## 2016-09-28 DIAGNOSIS — O365930 Maternal care for other known or suspected poor fetal growth, third trimester, not applicable or unspecified: Secondary | ICD-10-CM

## 2016-09-28 DIAGNOSIS — Z3A37 37 weeks gestation of pregnancy: Secondary | ICD-10-CM

## 2016-09-28 DIAGNOSIS — Z8614 Personal history of Methicillin resistant Staphylococcus aureus infection: Secondary | ICD-10-CM

## 2016-09-28 DIAGNOSIS — Z833 Family history of diabetes mellitus: Secondary | ICD-10-CM

## 2016-09-28 DIAGNOSIS — O99334 Smoking (tobacco) complicating childbirth: Secondary | ICD-10-CM | POA: Diagnosis present

## 2016-09-28 DIAGNOSIS — Z98891 History of uterine scar from previous surgery: Secondary | ICD-10-CM

## 2016-09-28 DIAGNOSIS — Z8249 Family history of ischemic heart disease and other diseases of the circulatory system: Secondary | ICD-10-CM | POA: Diagnosis not present

## 2016-09-28 DIAGNOSIS — O99344 Other mental disorders complicating childbirth: Secondary | ICD-10-CM | POA: Diagnosis present

## 2016-09-28 DIAGNOSIS — O34211 Maternal care for low transverse scar from previous cesarean delivery: Secondary | ICD-10-CM | POA: Diagnosis present

## 2016-09-28 DIAGNOSIS — F17210 Nicotine dependence, cigarettes, uncomplicated: Secondary | ICD-10-CM | POA: Diagnosis present

## 2016-09-28 LAB — HEPATITIS B SURFACE ANTIGEN: Hepatitis B Surface Ag: NEGATIVE

## 2016-09-28 LAB — CULTURE, BETA STREP (GROUP B ONLY): Strep Gp B Culture: NEGATIVE

## 2016-09-28 SURGERY — Surgical Case
Anesthesia: Spinal | Site: Abdomen | Wound class: Clean Contaminated

## 2016-09-28 MED ORDER — SENNOSIDES-DOCUSATE SODIUM 8.6-50 MG PO TABS
2.0000 | ORAL_TABLET | ORAL | Status: DC
  Administered 2016-09-28 – 2016-09-30 (×3): 2 via ORAL
  Filled 2016-09-28 (×3): qty 2

## 2016-09-28 MED ORDER — COCONUT OIL OIL
1.0000 "application " | TOPICAL_OIL | Status: DC | PRN
  Filled 2016-09-28: qty 120

## 2016-09-28 MED ORDER — SIMETHICONE 80 MG PO CHEW
80.0000 mg | CHEWABLE_TABLET | Freq: Three times a day (TID) | ORAL | Status: DC
  Administered 2016-09-28 – 2016-10-01 (×9): 80 mg via ORAL
  Filled 2016-09-28 (×9): qty 1

## 2016-09-28 MED ORDER — ONDANSETRON HCL 4 MG/2ML IJ SOLN: 4.0000 mg | Freq: Three times a day (TID) | INTRAMUSCULAR | Status: DC | PRN

## 2016-09-28 MED ORDER — DIPHENHYDRAMINE HCL 50 MG/ML IJ SOLN: 12.5000 mg | INTRAMUSCULAR | Status: DC | PRN

## 2016-09-28 MED ORDER — OXYCODONE HCL 5 MG PO TABS
5.0000 mg | ORAL_TABLET | ORAL | Status: DC | PRN
  Administered 2016-09-28: 5 mg via ORAL
  Filled 2016-09-28 (×2): qty 1

## 2016-09-28 MED ORDER — MORPHINE SULFATE (PF) 0.5 MG/ML IJ SOLN
INTRAMUSCULAR | Status: DC | PRN
  Administered 2016-09-28: .2 mg via INTRATHECAL

## 2016-09-28 MED ORDER — PHENYLEPHRINE 8 MG IN D5W 100 ML (0.08MG/ML) PREMIX OPTIME
INJECTION | INTRAVENOUS | Status: AC
  Filled 2016-09-28: qty 100

## 2016-09-28 MED ORDER — AMPHETAMINE-DEXTROAMPHETAMINE 10 MG PO TABS
15.0000 mg | ORAL_TABLET | Freq: Every day | ORAL | Status: DC
  Administered 2016-09-29: 15 mg via ORAL
  Administered 2016-09-30: 5 mg via ORAL
  Administered 2016-10-01: 15 mg via ORAL
  Filled 2016-09-28 (×6): qty 2

## 2016-09-28 MED ORDER — IBUPROFEN 600 MG PO TABS
600.0000 mg | ORAL_TABLET | Freq: Four times a day (QID) | ORAL | Status: DC
  Administered 2016-09-28 – 2016-10-01 (×12): 600 mg via ORAL
  Filled 2016-09-28 (×12): qty 1

## 2016-09-28 MED ORDER — DIPHENHYDRAMINE HCL 25 MG PO CAPS: 25.0000 mg | ORAL_CAPSULE | Freq: Four times a day (QID) | ORAL | Status: DC | PRN

## 2016-09-28 MED ORDER — SODIUM CHLORIDE 0.9% FLUSH: 3.0000 mL | INTRAVENOUS | Status: DC | PRN

## 2016-09-28 MED ORDER — OXYCODONE HCL 5 MG PO TABS
10.0000 mg | ORAL_TABLET | ORAL | Status: DC | PRN
  Administered 2016-09-29 – 2016-10-01 (×13): 10 mg via ORAL
  Filled 2016-09-28 (×14): qty 2

## 2016-09-28 MED ORDER — SIMETHICONE 80 MG PO CHEW: 80.0000 mg | CHEWABLE_TABLET | ORAL | Status: DC | PRN

## 2016-09-28 MED ORDER — CEFAZOLIN SODIUM-DEXTROSE 2-4 GM/100ML-% IV SOLN
2.0000 g | INTRAVENOUS | Status: AC
  Administered 2016-09-28: 2 g via INTRAVENOUS
  Filled 2016-09-28: qty 100

## 2016-09-28 MED ORDER — ALPRAZOLAM 0.5 MG PO TABS
1.0000 mg | ORAL_TABLET | Freq: Two times a day (BID) | ORAL | Status: DC
  Administered 2016-09-28 – 2016-10-01 (×7): 1 mg via ORAL
  Filled 2016-09-28 (×8): qty 2

## 2016-09-28 MED ORDER — KETOROLAC TROMETHAMINE 30 MG/ML IJ SOLN
INTRAMUSCULAR | Status: AC
  Filled 2016-09-28: qty 1

## 2016-09-28 MED ORDER — NALBUPHINE HCL 10 MG/ML IJ SOLN: 5.0000 mg | INTRAMUSCULAR | Status: DC | PRN

## 2016-09-28 MED ORDER — NALBUPHINE HCL 10 MG/ML IJ SOLN: 5.0000 mg | Freq: Once | INTRAMUSCULAR | Status: DC | PRN

## 2016-09-28 MED ORDER — FENTANYL CITRATE (PF) 100 MCG/2ML IJ SOLN
INTRAMUSCULAR | Status: AC
  Filled 2016-09-28: qty 2

## 2016-09-28 MED ORDER — LACTATED RINGERS IV BOLUS (SEPSIS)
500.0000 mL | Freq: Once | INTRAVENOUS | Status: AC
  Administered 2016-09-28: 500 mL via INTRAVENOUS

## 2016-09-28 MED ORDER — MEPERIDINE HCL 25 MG/ML IJ SOLN: 6.2500 mg | INTRAMUSCULAR | Status: DC | PRN

## 2016-09-28 MED ORDER — BUPIVACAINE IN DEXTROSE 0.75-8.25 % IT SOLN
INTRATHECAL | Status: DC | PRN
  Administered 2016-09-28: 1.4 mL via INTRATHECAL

## 2016-09-28 MED ORDER — FENTANYL CITRATE (PF) 100 MCG/2ML IJ SOLN
25.0000 ug | INTRAMUSCULAR | Status: DC | PRN
Start: 2016-09-28 — End: 2016-09-28
  Administered 2016-09-28: 25 ug via INTRAVENOUS
  Administered 2016-09-28: 50 ug via INTRAVENOUS
  Administered 2016-09-28: 25 ug via INTRAVENOUS

## 2016-09-28 MED ORDER — WITCH HAZEL-GLYCERIN EX PADS: 1.0000 "application " | MEDICATED_PAD | CUTANEOUS | Status: DC | PRN

## 2016-09-28 MED ORDER — DIBUCAINE 1 % RE OINT: 1.0000 "application " | TOPICAL_OINTMENT | RECTAL | Status: DC | PRN

## 2016-09-28 MED ORDER — OXYTOCIN 10 UNIT/ML IJ SOLN
INTRAVENOUS | Status: DC | PRN
  Administered 2016-09-28: 40 [IU] via INTRAVENOUS

## 2016-09-28 MED ORDER — PHENYLEPHRINE 8 MG IN D5W 100 ML (0.08MG/ML) PREMIX OPTIME
INJECTION | INTRAVENOUS | Status: DC | PRN
  Administered 2016-09-28: 60 ug/min via INTRAVENOUS

## 2016-09-28 MED ORDER — NALOXONE HCL 2 MG/2ML IJ SOSY
1.0000 ug/kg/h | PREFILLED_SYRINGE | INTRAVENOUS | Status: DC | PRN
  Filled 2016-09-28: qty 2

## 2016-09-28 MED ORDER — ZOLPIDEM TARTRATE 5 MG PO TABS: 5.0000 mg | ORAL_TABLET | Freq: Every evening | ORAL | Status: DC | PRN

## 2016-09-28 MED ORDER — ACETAMINOPHEN 325 MG PO TABS
650.0000 mg | ORAL_TABLET | ORAL | Status: DC | PRN
  Administered 2016-09-28 – 2016-10-01 (×10): 650 mg via ORAL
  Filled 2016-09-28 (×10): qty 2

## 2016-09-28 MED ORDER — SCOPOLAMINE 1 MG/3DAYS TD PT72: 1.0000 | MEDICATED_PATCH | Freq: Once | TRANSDERMAL | Status: DC

## 2016-09-28 MED ORDER — SIMETHICONE 80 MG PO CHEW
80.0000 mg | CHEWABLE_TABLET | ORAL | Status: DC
  Administered 2016-09-28 – 2016-09-30 (×3): 80 mg via ORAL
  Filled 2016-09-28 (×3): qty 1

## 2016-09-28 MED ORDER — FENTANYL CITRATE (PF) 100 MCG/2ML IJ SOLN
INTRAMUSCULAR | Status: DC | PRN
  Administered 2016-09-28: 10 ug via INTRATHECAL

## 2016-09-28 MED ORDER — KETOROLAC TROMETHAMINE 30 MG/ML IJ SOLN: 30.0000 mg | Freq: Four times a day (QID) | INTRAMUSCULAR | Status: AC | PRN

## 2016-09-28 MED ORDER — METOCLOPRAMIDE HCL 5 MG/ML IJ SOLN: 10.0000 mg | Freq: Once | INTRAMUSCULAR | Status: DC | PRN

## 2016-09-28 MED ORDER — ONDANSETRON HCL 4 MG/2ML IJ SOLN
INTRAMUSCULAR | Status: AC
  Filled 2016-09-28: qty 2

## 2016-09-28 MED ORDER — MENTHOL 3 MG MT LOZG: 1.0000 | LOZENGE | OROMUCOSAL | Status: DC | PRN

## 2016-09-28 MED ORDER — OXYTOCIN 40 UNITS IN LACTATED RINGERS INFUSION - SIMPLE MED: 2.5000 [IU]/h | INTRAVENOUS | Status: AC

## 2016-09-28 MED ORDER — KETOROLAC TROMETHAMINE 30 MG/ML IJ SOLN
30.0000 mg | Freq: Four times a day (QID) | INTRAMUSCULAR | Status: AC | PRN
  Administered 2016-09-28: 30 mg via INTRAMUSCULAR

## 2016-09-28 MED ORDER — PRENATAL MULTIVITAMIN CH
1.0000 | ORAL_TABLET | Freq: Every day | ORAL | Status: DC
  Administered 2016-09-29 – 2016-10-01 (×3): 1 via ORAL
  Filled 2016-09-28 (×3): qty 1

## 2016-09-28 MED ORDER — TETANUS-DIPHTH-ACELL PERTUSSIS 5-2.5-18.5 LF-MCG/0.5 IM SUSP
0.5000 mL | Freq: Once | INTRAMUSCULAR | Status: AC
  Administered 2016-10-01: 0.5 mL via INTRAMUSCULAR
  Filled 2016-09-28: qty 0.5

## 2016-09-28 MED ORDER — LACTATED RINGERS IV SOLN
INTRAVENOUS | Status: DC
  Administered 2016-09-28: 10:00:00 via INTRAVENOUS

## 2016-09-28 MED ORDER — OXYTOCIN 10 UNIT/ML IJ SOLN
INTRAMUSCULAR | Status: AC
  Filled 2016-09-28: qty 4

## 2016-09-28 MED ORDER — MORPHINE SULFATE (PF) 0.5 MG/ML IJ SOLN
INTRAMUSCULAR | Status: AC
  Filled 2016-09-28: qty 10

## 2016-09-28 MED ORDER — LACTATED RINGERS IV SOLN: INTRAVENOUS | Status: DC

## 2016-09-28 MED ORDER — LACTATED RINGERS IV SOLN
INTRAVENOUS | Status: DC | PRN
  Administered 2016-09-28 (×2): via INTRAVENOUS

## 2016-09-28 MED ORDER — NALOXONE HCL 0.4 MG/ML IJ SOLN: 0.4000 mg | INTRAMUSCULAR | Status: DC | PRN

## 2016-09-28 MED ORDER — LACTATED RINGERS IV SOLN
INTRAVENOUS | Status: DC
Start: 2016-09-28 — End: 2016-10-01

## 2016-09-28 MED ORDER — ONDANSETRON HCL 4 MG/2ML IJ SOLN
INTRAMUSCULAR | Status: DC | PRN
  Administered 2016-09-28: 4 mg via INTRAVENOUS

## 2016-09-28 MED ORDER — DIPHENHYDRAMINE HCL 25 MG PO CAPS
25.0000 mg | ORAL_CAPSULE | ORAL | Status: DC | PRN
  Filled 2016-09-28: qty 1

## 2016-09-28 SURGICAL SUPPLY — 36 items
APL SKNCLS STERI-STRIP NONHPOA (GAUZE/BANDAGES/DRESSINGS) ×1
BENZOIN TINCTURE PRP APPL 2/3 (GAUZE/BANDAGES/DRESSINGS) ×2 IMPLANT
CHLORAPREP W/TINT 26ML (MISCELLANEOUS) ×3 IMPLANT
CLAMP CORD UMBIL (MISCELLANEOUS) IMPLANT
CLOSURE WOUND 1/2 X4 (GAUZE/BANDAGES/DRESSINGS) ×1
CLOTH BEACON ORANGE TIMEOUT ST (SAFETY) ×3 IMPLANT
DRSG OPSITE POSTOP 4X10 (GAUZE/BANDAGES/DRESSINGS) ×3 IMPLANT
ELECT REM PT RETURN 9FT ADLT (ELECTROSURGICAL) ×3
ELECTRODE REM PT RTRN 9FT ADLT (ELECTROSURGICAL) ×1 IMPLANT
EXTRACTOR VACUUM KIWI (MISCELLANEOUS) IMPLANT
GLOVE BIO SURGEON ST LM GN SZ9 (GLOVE) ×3 IMPLANT
GLOVE BIOGEL PI IND STRL 7.0 (GLOVE) ×1 IMPLANT
GLOVE BIOGEL PI IND STRL 9 (GLOVE) ×1 IMPLANT
GLOVE BIOGEL PI INDICATOR 7.0 (GLOVE) ×2
GLOVE BIOGEL PI INDICATOR 9 (GLOVE) ×2
GOWN STRL REUS W/TWL 2XL LVL3 (GOWN DISPOSABLE) ×3 IMPLANT
GOWN STRL REUS W/TWL LRG LVL3 (GOWN DISPOSABLE) ×3 IMPLANT
NDL HYPO 25X5/8 SAFETYGLIDE (NEEDLE) IMPLANT
NEEDLE HYPO 25X5/8 SAFETYGLIDE (NEEDLE) IMPLANT
NS IRRIG 1000ML POUR BTL (IV SOLUTION) ×3 IMPLANT
PACK C SECTION WH (CUSTOM PROCEDURE TRAY) ×3 IMPLANT
PAD OB MATERNITY 4.3X12.25 (PERSONAL CARE ITEMS) ×3 IMPLANT
PENCIL SMOKE EVAC W/HOLSTER (ELECTROSURGICAL) ×3 IMPLANT
RTRCTR C-SECT PINK 25CM LRG (MISCELLANEOUS) IMPLANT
RTRCTR C-SECT PINK 34CM XLRG (MISCELLANEOUS) IMPLANT
STRIP CLOSURE SKIN 1/2X4 (GAUZE/BANDAGES/DRESSINGS) ×1 IMPLANT
SUT MNCRL 0 VIOLET CTX 36 (SUTURE) ×2 IMPLANT
SUT MONOCRYL 0 CTX 36 (SUTURE) ×4
SUT VIC AB 0 CT1 27 (SUTURE) ×3
SUT VIC AB 0 CT1 27XBRD ANBCTR (SUTURE) ×1 IMPLANT
SUT VIC AB 2-0 CT1 27 (SUTURE) ×3
SUT VIC AB 2-0 CT1 TAPERPNT 27 (SUTURE) ×1 IMPLANT
SUT VIC AB 4-0 KS 27 (SUTURE) ×3 IMPLANT
SYR BULB IRRIGATION 50ML (SYRINGE) IMPLANT
TOWEL OR 17X24 6PK STRL BLUE (TOWEL DISPOSABLE) ×3 IMPLANT
TRAY FOLEY BAG SILVER LF 14FR (SET/KITS/TRAYS/PACK) ×3 IMPLANT

## 2016-09-28 NOTE — Op Note (Signed)
Cesarean Section Operative Report  Carmen Martin  PROCEDURE DATE: 09/28/2016  PREOPERATIVE DIAGNOSES: Intrauterine pregnancy at [redacted]w[redacted]d weeks gestation; IUGR with abnormal dopplers  POSTOPERATIVE DIAGNOSES: The same  PROCEDURE: Repeat Low Transverse Cesarean Section  SURGEON:   Surgeon(s) and Role:    * Tilda Burrow, MD - Primary - Attending    * Michaele Offer, DO - Assisting - OB Fellow    ASSISTANT:  Jen Mow, DO - OB Fellow  INDICATIONS: Carmen Martin is a 21 y.o. [phone] at 101w0d here for cesarean section secondary to the indications listed under preoperative diagnoses; please see preoperative note for further details.  The risks of cesarean section were discussed with the patient including but were not limited to: bleeding which may require transfusion or reoperation; infection which may require antibiotics; injury to bowel, bladder, ureters or other surrounding organs; injury to the fetus; need for additional procedures including hysterectomy in the event of a life-threatening hemorrhage; placental abnormalities wth subsequent pregnancies, incisional problems, thromboembolic phenomenon and other postoperative/anesthesia complications.   The patient concurred with the proposed plan, giving informed written consent for the procedure.    FINDINGS:  Viable female infant in cephalic presentation, SGA.  Apgars 8 and 9. Preliminary weight 2100g. Clear amniotic fluid.  Intact placenta, three vessel cord.  Normal uterus, fallopian tubes and ovaries bilaterally.  ANESTHESIA: Spinal INTRAVENOUS FLUIDS: 2300 ml ESTIMATED BLOOD LOSS: 400 ml URINE OUTPUT:  300 ml SPECIMENS: Placenta sent to pathology COMPLICATIONS: None immediate  PROCEDURE IN DETAIL:  The patient preoperatively received intravenous antibiotics and had sequential compression devices applied to her lower extremities.  She was then taken to the operating room where spinal anesthesia was administered and was  found to be adequate. She was then placed in a dorsal supine position with a leftward tilt, and prepped and draped in a sterile manner.  A foley catheter was placed into her bladder and attached to constant gravity.    After an adequate timeout was performed, a Pfannenstiel skin incision was made with scalpel over her preexisting scar and carried through to the underlying layer of fascia. The fascia was incised in the midline, and this incision was extended bilaterally using the Mayo scissors.  Kocher clamps were applied to the superior aspect of the fascial incision and the underlying rectus muscles were dissected off bluntly.  A similar process was carried out on the inferior aspect of the fascial incision. The rectus muscles were separated in the midline bluntly and the peritoneum was entered bluntly. Attention was turned to the lower uterine segment where a low transverse hysterotomy was made with a scalpel and extended bilaterally bluntly.  The infant was successfully delivered, the cord was clamped and cut after one minute, and the infant was handed over to the awaiting neonatology team. Uterine massage was then administered, and the placenta delivered intact with a three-vessel cord. The uterus was then cleared of clots and debris.  The hysterotomy was closed with 0 Monocryl in a running fashion, and an imbricating layer was also placed with 0 Monocryl. The pelvis was cleared of all clot and debris. Irrigation was performed until clear. Hemostasis was confirmed on all surfaces.  The peritoneum was closed with a 2-0 Vicryl running stitch. The fascia was then closed using 0 Vicryl in a running fashion.  The subcutaneous layer was irrigated, then reapproximated with 2-0 plain gut interrupted stitches.  The skin was closed with a 4-0 Vicryl subcuticular stitch.   The patient tolerated the  procedure well. Sponge, lap, instrument and needle counts were correct x 3.  She was taken to the recovery room in stable  condition.     Disposition: PACU - hemodynamically stable.   Maternal Condition: stable    Signed: Jen MowElizabeth Mumaw, DO OB Fellow 09/28/2016 10:23 AM

## 2016-09-28 NOTE — Anesthesia Preprocedure Evaluation (Signed)
Anesthesia Evaluation  Patient identified by MRN, date of birth, ID band Patient awake    Reviewed: Allergy & Precautions, NPO status , Patient's Chart, lab work & pertinent test results  Airway Mallampati: II  TM Distance: >3 FB Neck ROM: Full    Dental  (+) Poor Dentition, Chipped, Missing   Pulmonary neg pulmonary ROS, Current Smoker,    Pulmonary exam normal breath sounds clear to auscultation       Cardiovascular negative cardio ROS Normal cardiovascular exam Rhythm:Regular Rate:Normal     Neuro/Psych negative neurological ROS  negative psych ROS   GI/Hepatic negative GI ROS, (+)     substance abuse  ,   Endo/Other  negative endocrine ROS  Renal/GU negative Renal ROS  negative genitourinary   Musculoskeletal negative musculoskeletal ROS (+)   Abdominal   Peds negative pediatric ROS (+)  Hematology negative hematology ROS (+)   Anesthesia Other Findings   Reproductive/Obstetrics (+) Pregnancy                             Anesthesia Physical Anesthesia Plan  ASA: II  Anesthesia Plan: Spinal   Post-op Pain Management:    Induction:   Airway Management Planned: Natural Airway  Additional Equipment:   Intra-op Plan:   Post-operative Plan:   Informed Consent: I have reviewed the patients History and Physical, chart, labs and discussed the procedure including the risks, benefits and alternatives for the proposed anesthesia with the patient or authorized representative who has indicated his/her understanding and acceptance.   Dental advisory given  Plan Discussed with: CRNA  Anesthesia Plan Comments:         Anesthesia Quick Evaluation

## 2016-09-28 NOTE — Transfer of Care (Signed)
Immediate Anesthesia Transfer of Care Note  Patient: Carmen Martin  Procedure(s) Performed: Procedure(s): REPEAT CESAREAN SECTION (N/A)  Patient Location: PACU  Anesthesia Type:Spinal  Level of Consciousness: awake  Airway & Oxygen Therapy: Patient Spontanous Breathing  Post-op Assessment: Report given to RN and Post -op Vital signs reviewed and stable  Post vital signs: Reviewed  Last Vitals:  Vitals:   09/28/16 0812  BP: 117/67  Pulse: 90  Temp: 37.1 C    Last Pain:  Vitals:   09/28/16 0812  TempSrc: Oral  PainSc: 0-No pain         Complications: No apparent anesthesia complications

## 2016-09-28 NOTE — Anesthesia Postprocedure Evaluation (Signed)
Anesthesia Post Note  Patient: Berna BueKristin N Michelin  Procedure(s) Performed: Procedure(s) (LRB): REPEAT CESAREAN SECTION (N/A)  Patient location during evaluation: Mother Baby Anesthesia Type: Spinal Level of consciousness: awake Pain management: pain level controlled Vital Signs Assessment: post-procedure vital signs reviewed and stable Respiratory status: spontaneous breathing Cardiovascular status: stable Postop Assessment: no headache, no backache, spinal receding, patient able to bend at knees, no signs of nausea or vomiting and adequate PO intake Anesthetic complications: no        Last Vitals:  Vitals:   09/28/16 1330 09/28/16 1430  BP: 90/66 (!) 93/44  Pulse: 84 65  Resp: 18 18  Temp:  36.4 C    Last Pain:  Vitals:   09/28/16 1430  TempSrc: Oral  PainSc: 5    Pain Goal:                 Andilynn Delavega

## 2016-09-28 NOTE — Lactation Note (Addendum)
This note was copied from a baby's chart. Lactation Consultation Note  Patient Name: Carmen Martin ZOXWR'UToday's Date: 09/28/2016 Reason for consult: Initial assessment;Late preterm infant;Infant < 6lbs  LPI 12 hours old. Mom reports that baby sleepy at the breast and she is not seeing much colostrum when she pumps. Discussed progression of milk coming to volume. Offered to assist with hand expression and latching the baby but mom declined. Reviewed LPI guidelines and parents given written guidelines. Parents had lots of clarifying questions, which were all answered. Parents have already given formula by bottle, but gave 3 times over 3 hours. Enc mom to put baby to breast with cues, and at least every 3 hours. Enc parents to supplement baby with EBM/formula according to guidelines, and then for mom to post-pump followed by hand expression. Discussed the benefit of each drop of EBM for baby. Enc parents to keep each total feeding time to 30 minutes. Discussed how feeding for long periods of time can burn too many calories for baby. Discussed progression of weight loss/gain for newborns. Enc parents to refer to LPI guidelines, and to call for assistance as needed.   Enc mom to call [facility] to inquire about getting a pump, and [facility].    Maternal Data Has patient been taught Hand Expression?: Yes (Per mom.) Does the patient have breastfeeding experience prior to this delivery?: No  Feeding Feeding Type: Bottle Fed - Formula Nipple Type: Slow - flow  LATCH Score/Interventions                      Lactation Tools Discussed/Used WIC Program: No ([facility] Monday, 09/29/16.Marland Kitchen.) Pump Review: Setup, frequency, and cleaning;Milk Storage Initiated by:: Bedside RN Date initiated:: 09/28/16   Consult Status Consult Status: Follow-up Date: 09/29/16 Follow-up type: In-patient    Sherlyn HayJennifer D Valeriano Bain 09/28/2016, 9:45  PM

## 2016-09-28 NOTE — Progress Notes (Signed)
Notified Dr. Aneta MinsPhillip of pt's urine output 50cc in the last 4 hours. Pitocin still infusing. Per Dr. Aneta MinsPhillip, finish pitocin and give bolus. Order received.

## 2016-09-28 NOTE — Anesthesia Procedure Notes (Signed)
Spinal  Patient location during procedure: OR Staffing Anesthesiologist: Maguire Sime Performed: anesthesiologist  Preanesthetic Checklist Completed: patient identified, site marked, surgical consent, pre-op evaluation, timeout performed, IV checked, risks and benefits discussed and monitors and equipment checked Spinal Block Patient position: sitting Prep: DuraPrep Patient monitoring: heart rate, continuous pulse ox and blood pressure Approach: right paramedian Location: L3-4 Injection technique: single-shot Needle Needle type: Whitacre  Needle gauge: 25 G Needle length: 9 cm Additional Notes Expiration date of kit checked and confirmed. Patient tolerated procedure well, without complications.       

## 2016-09-29 ENCOUNTER — Encounter (HOSPITAL_COMMUNITY): Payer: Self-pay | Admitting: *Deleted

## 2016-09-29 ENCOUNTER — Encounter (HOSPITAL_COMMUNITY): Payer: Self-pay

## 2016-09-29 LAB — CBC
HCT: 29.8 % — ABNORMAL LOW (ref 36.0–46.0)
HEMOGLOBIN: 10.1 g/dL — AB (ref 12.0–15.0)
MCH: 30.1 pg (ref 26.0–34.0)
MCHC: 33.9 g/dL (ref 30.0–36.0)
MCV: 89 fL (ref 78.0–100.0)
Platelets: 200 10*3/uL (ref 150–400)
RBC: 3.35 MIL/uL — AB (ref 3.87–5.11)
RDW: 15.5 % (ref 11.5–15.5)
WBC: 11.7 10*3/uL — ABNORMAL HIGH (ref 4.0–10.5)

## 2016-09-29 LAB — RUBELLA SCREEN: Rubella: 1.3 index (ref >0.99)

## 2016-09-29 MED ORDER — MEDROXYPROGESTERONE ACETATE 150 MG/ML IM SUSP
150.0000 mg | Freq: Once | INTRAMUSCULAR | Status: AC
  Administered 2016-10-01: 150 mg via INTRAMUSCULAR
  Filled 2016-09-29: qty 1

## 2016-09-29 NOTE — Progress Notes (Signed)
   09/29/16 0345  Vital Signs  BP (!) 86/46  BP Location Left Arm  Patient Position (if appropriate) Supine  BP Method Automatic  Pulse Rate 81  Pulse Rate Source Dinamap  Resp 18  Temp 98.6 F (37 C)  Temp Source Axillary  POSS Scale (Pasero Opioid Sedation Scale)  POSS *See Group Information* 1-Acceptable,Awake and alert  Oxygen Therapy  SpO2 96 %  O2 Device Room Air  Called MD regarding BP. Pt not symptomatic. Per Philipp DeputyKim Shaw, CNM, continue to monitor, no new orders.

## 2016-09-29 NOTE — Progress Notes (Signed)
Post OP DAY #1 Subjective: no complaints, up ad lib, voiding, tolerating PO and reports normal lochia, ambulating without dizziness  Objective: Blood pressure 96/63, pulse 69, temperature 97.7 F (36.5 C), temperature source Axillary, resp. rate 17, height 5\' 2"  (1.575 m), weight 61.7 kg (136 lb), SpO2 96 %, unknown if currently breastfeeding.  Physical Exam:  General: alert Lochia: appropriate Uterine Fundus: firm and NT at U-1 Dressing: c/d/i DVT Evaluation: No evidence of DVT seen on physical exam.   Recent Labs  09/26/16 1205 09/29/16 0533  HGB 12.6 10.1*  HCT 37.7 29.8*    Assessment/Plan: Plan for discharge tomorrow   LOS: 1 day   Lauralynn Loeb C Othell Diluzio 09/29/2016, 8:10 AM

## 2016-09-29 NOTE — Progress Notes (Signed)
CSW received call from Carmen Martin/[place] CPS worker who states she is currently involved with MOB.  CSW provided [name] with information based on MOB's PNR and faxed drug screen results during pregnancy.  CSW plans to meet with MOB to complete assessment and will continue to monitor closely. 

## 2016-09-29 NOTE — Lactation Note (Signed)
This note was copied from a baby's chart. Lactation Consultation Note  Patient Name: Carmen Martin ZOXWR'UToday's Date: 09/29/2016 Reason for consult: Follow-up assessment;Infant < 6lbs;Late preterm infant Mom reports she is offering the breast with some feedings. Parents are supplementing every 2-3 hours with Neosure 10-15 ml on average. Mom reports she is pumping for 15 minutes but not receiving EBM yet. Mom does want to continue to work with BF. Mom has history of anxiety/ADHD. Taking Xanax 1 mg BID and Adderall 15 mg BID, both L3 per [place]. Precautions reviewed with parents. Baby to have prolonged stay to observe for withdrawal symptoms and due to early term/small baby. LC advised Mom to offer breast with feeding but limit time at breast to 30 minutes once baby starting latching better. This to conserve calorie usage with feedings. Parents to continue to supplement every 3 hours per LPT policy 10-20 ml today. Parents report having LPT policy guidelines. Mom to pump every 3 hours for 15 minutes to encourage milk production and to have EBM to supplement. LC encouraged Mom to call for LC to observe feeding at breast.   Maternal Data    Feeding Feeding Type: Formula Nipple Type: Slow - flow  LATCH Score/Interventions                      Lactation Tools Discussed/Used Tools: Pump Breast pump type: Double-Electric Breast Pump   Consult Status Consult Status: Follow-up Date: 09/29/16 Follow-up type: In-patient    Alfred LevinsGranger, Eugean Arnott Ann 09/29/2016, 11:56 AM

## 2016-09-30 NOTE — Plan of Care (Signed)
Problem: Pain Managment: Goal: General experience of comfort will improve Outcome: Progressing Patient requests narcotic pain medication with either Tylenol or Ibuprofen every 4-5 hours for incisional pain levels of 7-8. Her pain does not decrease significantly after medication per patient report. Patient is able to ambulate in halls and outside frequently. Will continue to monitor pain and provide information about ambulation and spacing meds as tolerated to avoid side effects.  Problem: Skin Integrity: Goal: Risk for impaired skin integrity will decrease Outcome: Progressing Patient has generalized dry patches and skin redness (eczema)    Problem: Coping: Goal: Ability to cope will improve Patient tearful and emotional. CPS and Social Work have seen patient. See notes.

## 2016-09-30 NOTE — Progress Notes (Signed)
Pain mgmt a challenge with Pt. Carmen Martin. She states the ordered meds relieve pain for a couple hours maybe and has to wait until meds are due again. This RN tried heat and cold measures as well but did not help. Pt anxious with history of positive drug screen including opiates and Benzodiazepines. Social work and CPS involved with plan for baby.

## 2016-09-30 NOTE — Progress Notes (Signed)
Post OP DAY #2 Subjective: no complaints, up ad lib, voiding, tolerating PO and She would like to stay until tomorrow as the baby probably won't be discharged today, reports normal lochia  Objective: Blood pressure 108/62, pulse 81, temperature 98.2 F (36.8 C), temperature source Oral, resp. rate 18, height 5\' 2"  (1.575 m), weight 61.7 kg (136 lb), SpO2 98 %, unknown if currently breastfeeding.  Physical Exam:  General: alert Lochia: appropriate Uterine Fundus: firm and appropriately tender at U DVT Evaluation: No evidence of DVT seen on physical exam.   Recent Labs  09/29/16 0533  HGB 10.1*  HCT 29.8*    Assessment/Plan: Plan for discharge tomorrow   LOS: 2 days   Carmen BossierMyra C Tayshaun Martin 09/30/2016, 6:51 AM

## 2016-09-30 NOTE — Progress Notes (Addendum)
CLINICAL SOCIAL WORK MATERNAL/CHILD NOTE  Patient Details  Name: Girl Susane Bey MRN: [id number] Date of Birth: 09/28/2017  Date:  10/01/2016  Clinical Social Worker Initiating Note:  Terri Piedra, [place] Date/ Time Initiated:  09/30/16/1400     Child's Name:  Marrion Coy Chance   Legal Guardian:  Other (Comment) (Parents: Athena Masse and Lysbeth Galas Chance)   Need for Interpreter:  None   Date of Referral:  09/30/16     Reason for Referral:  Behavioral Health Issues, including SI , Current Substance Use/Substance Use During Pregnancy , Late or No Prenatal Care    Referral Source:  [facility]   Address:  [address] [place], [place] [address]  Phone number:  [phone]   Household Members:  Friends (MOB reports that she lives with her "cousin" who is a good friend from childhood and the friend's 67 year old son and boyfriend.)   Natural Supports (not living in the home):  Spouse/significant other, Immediate Family (MOB reports that FOB is involved and supportive, but not living with her.  Her sister and FOB's grandmother are supportive.)   Professional Supports: Other (Comment) (MOB sees Dr. Reece Levy for psychiatric care.)   Employment: Unemployed   Type of Work:     Education:      Museum/gallery curator Resources:  [employer]   Other Resources:      Cultural/Religious Considerations Which May Impact Care: None stated.  MOB's facesheet notes religion as [place].  Strengths:  Home prepared for child    Risk Factors/Current Problems:  DHHS Involvement , Legal Issues , Transportation , Mental Health Concerns , Substance Use    Cognitive State:  Able to Concentrate , Linear Thinking , Goal Oriented    Mood/Affect:  Calm , Interested , Relaxed    CSW Assessment: CSW met with parents in MOB's first floor room/131 to offer support and complete assessment due to [facility], Anx/Dep, substance use and questionable homelessness.  Parents were pleasant and welcoming of CSW's visit.   They report being aware that CSW was coming to speak with them and state that [place] worker has already been here to talk with them also.   MOB reports that she is hopeful that CPS will allow her and the baby to live with her "cousin" Jeralene Peters, whom she refers to as her cousin, but is actually a very good childhood friend.  MOB reports that she has been living with Mickel Baas for the past two weeks, after a period of homelessness, and that Mickel Baas has welcomed her to bring the baby to her home as well.  She states Laura's almost 41 year old son and boyfriend also live in the home.  She states Mickel Baas reached out to her when she found out she was pregnant and needed a place to stay.  She states Mickel Baas has sisters who are also supportive.   MOB reports that her sister/Kimberly Joaquim Lai is supportive and had agreed to take temporary custody of the baby until MOB and FOB became stable again, but states that as delivery was nearing, her sister stated that she wanted to adopt the baby.  MOB reports that she wants to parent and is not interested in allowing her sister to adopt the baby.  MOB reports that she still considers her sister as a good support person. Parents have one other child together/Skylar Chance, who is 53 years old.  FOB reports that she lives with his grandmother and that CPS is involved.  They have insight regarding the fact that  CPS may not be able to justify putting a newborn in their care when they have not yet reunited with their 22 year old, but remain very hopeful that they will look at the two situations separately.  CSW commended them for this insight and agreed that it is not likely that CPS will allow physical custody of a newborn when they aren't allowing physical custody of an older child.  FOB received a call from Mickel Baas while CSW was in the room and reports that per Mickel Baas, the home visit went well with CPS.  Parents state understanding that CPS worker now has to staff case with supervisor. Parents  report having all necessary items at Laura's house for the baby.  They are aware of SIDS precautions as reviewed by CSW.   CSW explained hospital drug screen policy due to limited [facility] and positive drug screens in pregnancy for Opiates without prescription.  Parents were understanding.  MOB reports being prescribed Xanax and Adderall but denies opiate use.  She states she took a tylenol from a friend at some point in pregnancy and thinks maybe she didn't actually know what she was given.  CSW informed her that she has 3 positive opiate screens documented in pregnancy: 06/05/16, 09/09/16 and 09/19/16.  She then reports that she may have been given something at [facility] around the time she found out she was pregnant.  CSW notes a similar account to a different CSW when assessed after Skylar's delivery.  CSW did not push the issue, but asked if MOB felt she needs any resources for substance use.  She denies.  Baby's UDS is negative. MOB reports dxs of Bipolar and Anxiety.  She reports she receives medication from Dr. Reece Levy at [place].  She states she has had counseling in the past and is interested in restarting because she feels Dr. Reece Levy is hard to talk to about how she is feeling.  CSW provided resources, which MOB was thankful for.  Parents informed CSW of the loss of their first baby at 28 weeks 5 years ago.  FOB states he did not receive counseling after this loss.  FOB became choked up and wiped his eyes with his shirt.  CSW strongly recommends counseling to both parents.  CSW provided education regarding signs and symptoms of PMADs and gave MOB a new mom checklist as a way to self evaluate.  Parents seemed appreciative of CSW's concern for their emotional wellbeing.  MOB also informed CSW that she has been in treatment for mental illness since she was 21 years old when her father committed suicide.  She states she experienced the loss of her mother a few years ago as well.  FOB states that have "been  through a lot together."  He states he is "trying to stay out of it" as far as living with MOB or being an active caretaker for his children because he has "legal issues" and is facing jail time.   CSW contacted CPS worker after leaving MOB's room.  Per CPS, parents are not allowed to be the primary caregivers to baby at this time and CPS worker states she has just notified them of this.  She reports that she will be evaluating MOB's sister/Kimberly Stone as a Special educational needs teacher.  CSW asked that CPS worker keep her updated.    CSW Plan/Description:  Child Copy Report , Information/Referral to [employer] , Patient/Family Education     Terri Piedra [place], [place] 10/01/2016, 9:31 AM

## 2016-10-01 MED ORDER — OXYCODONE HCL 5 MG PO TABS: 5.0000 mg | ORAL_TABLET | Freq: Four times a day (QID) | ORAL | 0 refills | Status: DC | PRN

## 2016-10-01 MED ORDER — OXYCODONE HCL 5 MG PO TABS
5.0000 mg | ORAL_TABLET | Freq: Four times a day (QID) | ORAL | 0 refills | Status: DC | PRN
Start: 2016-10-01 — End: 2016-10-01

## 2016-10-01 NOTE — Discharge Summary (Signed)
OB Discharge Summary     Patient Name: Carmen Martin DOB: 08-31-1995 MRN: [id number]  Date of admission: 09/28/2016 Delivering MD: Tilda Burrow   Date of discharge: 10/01/2016  Admitting diagnosis: PREGNANCY 37 WEEKS, IUGR Intrauterine pregnancy: [redacted]w[redacted]d     Secondary diagnosis:  Active Problems:   Status post repeat low transverse cesarean section  Additional problems: substance abuse     Discharge diagnosis: Term Pregnancy Delivered                                                                                                Post partum procedures:none  Augmentation: n/a  Complications: None  Hospital course:  Sceduled C/S   21 y.o. yo [id number] at [redacted]w[redacted]d was admitted to the hospital 09/28/2016 for scheduled cesarean section with the following indication:IUGR, declines TOLAC.  Membrane Rupture Time/Date: 9:37 AM ,09/28/2016   Patient delivered a Viable infant.09/28/2016  Details of operation can be found in separate operative note.  Pateint had an uncomplicated postpartum course.  She is ambulating, tolerating a regular diet, passing flatus, and urinating well. Patient is discharged home in stable condition on  10/01/16         Physical exam  Vitals:   09/29/16 1818 09/30/16 0500 09/30/16 1745 10/01/16 0550  BP: (!) 99/56 108/62 111/62 107/66  Pulse: 91 81 91 74  Resp: 18 18 18 18   Temp: 98.5 F (36.9 C) 98.2 F (36.8 C) 98.3 F (36.8 C) 98.1 F (36.7 C)  TempSrc: Oral Oral Axillary Oral  SpO2: 98%     Weight:      Height:       General: alert, cooperative and no distress Lochia: appropriate Uterine Fundus: firm Incision: Healing well with no significant drainage, No significant erythema, Dressing is clean, dry, and intact DVT Evaluation: No evidence of DVT seen on physical exam. Negative Homan's sign. No cords or calf tenderness. No significant calf/ankle edema. Labs: Lab Results  Component Value Date   WBC 11.7 (H) 09/29/2016   HGB 10.1 (L) 09/29/2016   HCT 29.8 (L) 09/29/2016   MCV 89.0 09/29/2016   PLT 200 09/29/2016   CMP Latest Ref Rng & Units 03/30/2016  Glucose 65 - 99 mg/dL 99  BUN 6 - 20 mg/dL 7  Creatinine 1.47 - 8.29 mg/dL 5.62  Sodium 130 - 865 mmol/L 134(L)  Potassium 3.5 - 5.1 mmol/L 3.3(L)  Chloride 101 - 111 mmol/L 107  CO2 22 - 32 mmol/L 20(L)  Calcium 8.9 - 10.3 mg/dL 7.8(I)  Total Protein 6.0 - 8.3 g/dL -  Total Bilirubin 0.3 - 1.2 mg/dL -  Alkaline Phos 39 - 696 U/L -  AST 0 - 37 U/L -  ALT 0 - 35 U/L -    Discharge instruction: per After Visit Summary and "Baby and Me Booklet".  After visit meds:  Allergies as of 10/01/2016      Reactions   Biaxin [clarithromycin] Anaphylaxis   Vicodin [hydrocodone-acetaminophen] Itching, Other (See Comments)   Pt states that it makes her face hot.        Medication List  TAKE these medications   alprazolam 2 MG tablet Commonly known as:  XANAX Take 2 mg by mouth 2 (two) times daily.   amphetamine-dextroamphetamine 15 MG 24 hr capsule Commonly known as:  ADDERALL XR Take 15 mg by mouth every morning.   oxyCODONE 5 MG immediate release tablet Commonly known as:  Oxy IR/ROXICODONE Take 1 tablet (5 mg total) by mouth every 6 (six) hours as needed for moderate pain or severe pain.   prenatal vitamin w/FE, FA 27-1 MG Tabs tablet Take 1 tablet by mouth daily at 12 noon. What changed:  when to take this       Diet: routine diet  Activity: Advance as tolerated. Pelvic rest for 6 weeks.   Outpatient follow up:4/9 as scheduled for incision check, then 4-6wks for pp visit, then IUD insertion Follow up Appt:Future Appointments Date Time Provider Department Center  10/13/2016 11:15 AM Tilda BurrowJohn Ferguson V, MD [facility]   Follow up Visit:No Follow-up on file.  Postpartum contraception: abstinence until IUD insertion  Newborn Data: Live born female  Birth Weight: 4 lb 10.6 oz (2115 g) APGAR: 8, 9  Baby Feeding: Breast Disposition:rooming in, 4lbs, active  CPS case, pt's sister plans to be legal guardian until pt can 'get it together'   10/01/2016 Marge DuncansBooker, Kimberly Randall, CNM

## 2016-10-01 NOTE — Lactation Note (Addendum)
This note was copied from a baby's chart. Lactation Consultation Note  Patient Name: Carmen Martin'UToday's Date: 10/01/2016 Reason for consult: Follow-up assessment  With this mom of an early term small baby, now 4 lbs 7 oz, and 37 3/7 weeks CGA. Mom was eating, with baby  in her lap, with a bottle in her mouth, and her foot supporting baby's back, in supine position.She did not have her hands anywhere on the baby.  I explained to mom that this was not  a safe way to feed her baby, that she needed to support her head and neck, hold her, and side lie her. Mom said she was trying to eat. I told her to either feed the baby, and then eat, or eat, put the baby in her crib, and then feed the baby. Mom pumped about 5 ml's of milk. I dicussed with her the risk of using her milk, especially with taking Zanax twice a day. I told her to watch her baby closely after EBM fed to baby, , since her meds could cause the baby to have poor breathing, and possible SIDS. Mom said she had a different plan for when the baby goes home, but at the same time, asked for a hand pump, to use until [facility]  I also told mom she had to take her meds as ordered, because if she took more than ordered, or any additional drugs, this would be very dangerous for her baby.  Mom not very receptive to my teaching, and she was more interested in getting herself more meds, before she was discharged today, as a patient. Dr Margo AyeHall aware I was speaking to mom about above. I will update her on the feeding position also.    Maternal Data    Feeding    LATCH Score/Interventions                      Lactation Tools Discussed/Used     Consult Status      Alfred LevinsLee, Daisie Haft Anne 10/01/2016, 12:35 PM

## 2016-10-01 NOTE — Discharge Instructions (Signed)
Postpartum Care After Vaginal Delivery  The period of time right after you deliver your newborn is called the postpartum period.  What kind of medical care will I receive?  · You may continue to receive fluids and medicines through an IV tube inserted into one of your veins.  · If an incision was made near your vagina (episiotomy) or if you had some vaginal tearing during delivery, cold compresses may be placed on your episiotomy or your tear. This helps to reduce pain and swelling.  · You may be given a squirt bottle to use when you go to the bathroom. You may use this until you are comfortable wiping as usual. To use the squirt bottle, follow these steps:  ? Before you urinate, fill the squirt bottle with warm water. Do not use hot water.  ? After you urinate, while you are sitting on the toilet, use the squirt bottle to rinse the area around your urethra and vaginal opening. This rinses away any urine and blood.  ? You may do this instead of wiping. As you start healing, you may use the squirt bottle before wiping yourself. Make sure to wipe gently.  ? Fill the squirt bottle with clean water every time you use the bathroom.  · You will be given sanitary pads to wear.  How can I expect to feel?  · You may not feel the need to urinate for several hours after delivery.  · You will have some soreness and pain in your abdomen and vagina.  · If you are breastfeeding, you may have uterine contractions every time you breastfeed for up to several weeks postpartum. Uterine contractions help your uterus return to its normal size.  · It is normal to have vaginal bleeding (lochia) after delivery. The amount and appearance of lochia is often similar to a menstrual period in the first week after delivery. It will gradually decrease over the next few weeks to a dry, yellow-brown discharge. For most women, lochia stops completely by 6-8 weeks after delivery. Vaginal bleeding can vary from woman to woman.  · Within the first few  days after delivery, you may have breast engorgement. This is when your breasts feel heavy, full, and uncomfortable. Your breasts may also throb and feel hard, tightly stretched, warm, and tender. After this occurs, you may have milk leaking from your breasts. Your health care provider can help you relieve discomfort due to breast engorgement. Breast engorgement should go away within a few days.  · You may feel more sad or worried than normal due to hormonal changes after delivery. These feelings should not last more than a few days. If these feelings do not go away after several days, speak with your health care provider.  How should I care for myself?  · Tell your health care provider if you have pain or discomfort.  · Drink enough water to keep your urine clear or pale yellow.  · Wash your hands thoroughly with soap and water for at least 20 seconds after changing your sanitary pads, after using the toilet, and before holding or feeding your baby.  · If you are not breastfeeding, avoid touching your breasts a lot. Doing this can make your breasts produce more milk.  · If you become weak or lightheaded, or you feel like you might faint, ask for help before:  ? Getting out of bed.  ? Showering.  · Change your sanitary pads frequently. Watch for any changes in your flow, such as   a sudden increase in volume, a change in color, the passing of large blood clots. If you pass a blood clot from your vagina, save it to show to your health care provider. Do not flush blood clots down the toilet without having your health care provider look at them.  · Make sure that all your vaccinations are up to date. This can help protect you and your baby from getting certain diseases. You may need to have immunizations done before you leave the hospital.  · If desired, talk with your health care provider about methods of family planning or birth control (contraception).  How can I start bonding with my baby?  Spending as much time as  possible with your baby is very important. During this time, you and your baby can get to know each other and develop a bond. Having your baby stay with you in your room (rooming in) can give you time to get to know your baby. Rooming in can also help you become comfortable caring for your baby. Breastfeeding can also help you bond with your baby.  How can I plan for returning home with my baby?  · Make sure that you have a car seat installed in your vehicle.  ? Your car seat should be checked by a certified car seat installer to make sure that it is installed safely.  ? Make sure that your baby fits into the car seat safely.  · Ask your health care provider any questions you have about caring for yourself or your baby. Make sure that you are able to contact your health care provider with any questions after leaving the hospital.  This information is not intended to replace advice given to you by your health care provider. Make sure you discuss any questions you have with your health care provider.  Document Released: 04/20/2007 Document Revised: 11/26/2015 Document Reviewed: 05/28/2015  Elsevier Interactive Patient Education © 2017 [employer]    Breastfeeding  Deciding to breastfeed is one of the best choices you can make for you and your baby. A change in hormones during pregnancy causes your breast tissue to grow and increases the number and size of your milk ducts. These hormones also allow proteins, sugars, and fats from your blood supply to make breast milk in your milk-producing glands. Hormones prevent breast milk from being released before your baby is born as well as prompt milk flow after birth. Once breastfeeding has begun, thoughts of your baby, as well as his or her sucking or crying, can stimulate the release of milk from your milk-producing glands.  Benefits of breastfeeding  For Your Baby  · Your first milk (colostrum) helps your baby's digestive system function better.  · There are antibodies in  your milk that help your baby fight off infections.  · Your baby has a lower incidence of asthma, allergies, and sudden infant death syndrome.  · The nutrients in breast milk are better for your baby than infant formulas and are designed uniquely for your baby’s needs.  · Breast milk improves your baby's brain development.  · Your baby is less likely to develop other conditions, such as childhood obesity, asthma, or type 2 diabetes mellitus.    For You  · Breastfeeding helps to create a very special bond between you and your baby.  · Breastfeeding is convenient. Breast milk is always available at the correct temperature and costs nothing.  · Breastfeeding helps to burn calories and helps you lose the weight   gained during pregnancy.  · Breastfeeding makes your uterus contract to its prepregnancy size faster and slows bleeding (lochia) after you give birth.  · Breastfeeding helps to lower your risk of developing type 2 diabetes mellitus, osteoporosis, and breast or ovarian cancer later in life.    Signs that your baby is hungry  Early Signs of Hunger  · Increased alertness or activity.  · Stretching.  · Movement of the head from side to side.  · Movement of the head and opening of the mouth when the corner of the mouth or cheek is stroked (rooting).  · Increased sucking sounds, smacking lips, cooing, sighing, or squeaking.  · Hand-to-mouth movements.  · Increased sucking of fingers or hands.    Late Signs of Hunger  · Fussing.  · Intermittent crying.    Extreme Signs of Hunger   Signs of extreme hunger will require calming and consoling before your baby will be able to breastfeed successfully. Do not wait for the following signs of extreme hunger to occur before you initiate breastfeeding:  · Restlessness.  · A loud, strong cry.  · Screaming.    Breastfeeding basics   Breastfeeding Initiation  · Find a comfortable place to sit or lie down, with your neck and back well supported.  · Place a pillow or rolled up blanket  under your baby to bring him or her to the level of your breast (if you are seated). Nursing pillows are specially designed to help support your arms and your baby while you breastfeed.  · Make sure that your baby's abdomen is facing your abdomen.  · Gently massage your breast. With your fingertips, massage from your chest wall toward your nipple in a circular motion. This encourages milk flow. You may need to continue this action during the feeding if your milk flows slowly.  · Support your breast with 4 fingers underneath and your thumb above your nipple. Make sure your fingers are well away from your nipple and your baby’s mouth.  · Stroke your baby's lips gently with your finger or nipple.  · When your baby's mouth is open wide enough, quickly bring your baby to your breast, placing your entire nipple and as much of the colored area around your nipple (areola) as possible into your baby's mouth.  ? More areola should be visible above your baby's upper lip than below the lower lip.  ? Your baby's tongue should be between his or her lower gum and your breast.  · Ensure that your baby's mouth is correctly positioned around your nipple (latched). Your baby's lips should create a seal on your breast and be turned out (everted).  · It is common for your baby to suck about 2-3 minutes in order to start the flow of breast milk.    Latching   Teaching your baby how to latch on to your breast properly is very important. An improper latch can cause nipple pain and decreased milk supply for you and poor weight gain in your baby. Also, if your baby is not latched onto your nipple properly, he or she may swallow some air during feeding. This can make your baby fussy. Burping your baby when you switch breasts during the feeding can help to get rid of the air. However, teaching your baby to latch on properly is still the best way to prevent fussiness from swallowing air while breastfeeding.  Signs that your baby has  successfully latched on to your nipple:  · Silent   tugging or silent sucking, without causing you pain.  · Swallowing heard between every 3-4 sucks.  · Muscle movement above and in front of his or her ears while sucking.    Signs that your baby has not successfully latched on to nipple:  · Sucking sounds or smacking sounds from your baby while breastfeeding.  · Nipple pain.    If you think your baby has not latched on correctly, slip your finger into the corner of your baby’s mouth to break the suction and place it between your baby's gums. Attempt breastfeeding initiation again.  Signs of Successful Breastfeeding   Signs from your baby:  · A gradual decrease in the number of sucks or complete cessation of sucking.  · Falling asleep.  · Relaxation of his or her body.  · Retention of a small amount of milk in his or her mouth.  · Letting go of your breast by himself or herself.    Signs from you:  · Breasts that have increased in firmness, weight, and size 1-3 hours after feeding.  · Breasts that are softer immediately after breastfeeding.  · Increased milk volume, as well as a change in milk consistency and color by the fifth day of breastfeeding.  · Nipples that are not sore, cracked, or bleeding.    Signs That Your Baby is Getting Enough Milk  · Wetting at least 1-2 diapers during the first 24 hours after birth.  · Wetting at least 5-6 diapers every 24 hours for the first week after birth. The urine should be clear or pale yellow by 5 days after birth.  · Wetting 6-8 diapers every 24 hours as your baby continues to grow and develop.  · At least 3 stools in a 24-hour period by age 5 days. The stool should be soft and yellow.  · At least 3 stools in a 24-hour period by age 7 days. The stool should be seedy and yellow.  · No loss of weight greater than 10% of birth weight during the first 3 days of age.  · Average weight gain of 4-7 ounces (113-198 g) per week after age 4 days.  · Consistent daily weight gain by age 5  days, without weight loss after the age of 2 weeks.    After a feeding, your baby may spit up a small amount. This is common.  Breastfeeding frequency and duration  Frequent feeding will help you make more milk and can prevent sore nipples and breast engorgement. Breastfeed when you feel the need to reduce the fullness of your breasts or when your baby shows signs of hunger. This is called "breastfeeding on demand." Avoid introducing a pacifier to your baby while you are working to establish breastfeeding (the first 4-6 weeks after your baby is born). After this time you may choose to use a pacifier. Research has shown that pacifier use during the first year of a baby's life decreases the risk of sudden infant death syndrome (SIDS).  Allow your baby to feed on each breast as long as he or she wants. Breastfeed until your baby is finished feeding. When your baby unlatches or falls asleep while feeding from the first breast, offer the second breast. Because newborns are often sleepy in the first few weeks of life, you may need to awaken your baby to get him or her to feed.  Breastfeeding times will vary from baby to baby. However, the following rules can serve as a guide to help you ensure   that your baby is properly fed:  · Newborns (babies 4 weeks of age or younger) may breastfeed every 1-3 hours.  · Newborns should not go longer than 3 hours during the day or 5 hours during the night without breastfeeding.  · You should breastfeed your baby a minimum of 8 times in a 24-hour period until you begin to introduce solid foods to your baby at around 6 months of age.    Breast milk pumping  Pumping and storing breast milk allows you to ensure that your baby is exclusively fed your breast milk, even at times when you are unable to breastfeed. This is especially important if you are going back to work while you are still breastfeeding or when you are not able to be present during feedings. Your lactation consultant can give  you guidelines on how long it is safe to store breast milk.  A breast pump is a machine that allows you to pump milk from your breast into a sterile bottle. The pumped breast milk can then be stored in a refrigerator or freezer. Some breast pumps are operated by hand, while others use electricity. Ask your lactation consultant which type will work best for you. Breast pumps can be purchased, but some hospitals and breastfeeding support groups lease breast pumps on a monthly basis. A lactation consultant can teach you how to hand express breast milk, if you prefer not to use a pump.  Caring for your breasts while you breastfeed  Nipples can become dry, cracked, and sore while breastfeeding. The following recommendations can help keep your breasts moisturized and healthy:  · Avoid using soap on your nipples.  · Wear a supportive bra. Although not required, special nursing bras and tank tops are designed to allow access to your breasts for breastfeeding without taking off your entire bra or top. Avoid wearing underwire-style bras or extremely tight bras.  · Air dry your nipples for 3-4 minutes after each feeding.  · Use only cotton bra pads to absorb leaked breast milk. Leaking of breast milk between feedings is normal.  · Use lanolin on your nipples after breastfeeding. Lanolin helps to maintain your skin's normal moisture barrier. If you use pure lanolin, you do not need to wash it off before feeding your baby again. Pure lanolin is not toxic to your baby. You may also hand express a few drops of breast milk and gently massage that milk into your nipples and allow the milk to air dry.    In the first few weeks after giving birth, some women experience extremely full breasts (engorgement). Engorgement can make your breasts feel heavy, warm, and tender to the touch. Engorgement peaks within 3-5 days after you give birth. The following recommendations can help ease engorgement:  · Completely empty your breasts while  breastfeeding or pumping. You may want to start by applying warm, moist heat (in the shower or with warm water-soaked hand towels) just before feeding or pumping. This increases circulation and helps the milk flow. If your baby does not completely empty your breasts while breastfeeding, pump any extra milk after he or she is finished.  · Wear a snug bra (nursing or regular) or tank top for 1-2 days to signal your body to slightly decrease milk production.  · Apply ice packs to your breasts, unless this is too uncomfortable for you.  · Make sure that your baby is latched on and positioned properly while breastfeeding.    If engorgement persists after 48 hours   of following these recommendations, contact your health care provider or a lactation consultant.  Overall health care recommendations while breastfeeding  · Eat healthy foods. Alternate between meals and snacks, eating 3 of each per day. Because what you eat affects your breast milk, some of the foods may make your baby more irritable than usual. Avoid eating these foods if you are sure that they are negatively affecting your baby.  · Drink milk, fruit juice, and water to satisfy your thirst (about 10 glasses a day).  · Rest often, relax, and continue to take your prenatal vitamins to prevent fatigue, stress, and anemia.  · Continue breast self-awareness checks.  · Avoid chewing and smoking tobacco. Chemicals from cigarettes that pass into breast milk and exposure to secondhand smoke may harm your baby.  · Avoid alcohol and drug use, including marijuana.  Some medicines that may be harmful to your baby can pass through breast milk. It is important to ask your health care provider before taking any medicine, including all over-the-counter and prescription medicine as well as vitamin and herbal supplements.  It is possible to become pregnant while breastfeeding. If birth control is desired, ask your health care provider about options that will be safe for your  baby.  Contact a health care provider if:  · You feel like you want to stop breastfeeding or have become frustrated with breastfeeding.  · You have painful breasts or nipples.  · Your nipples are cracked or bleeding.  · Your breasts are red, tender, or warm.  · You have a swollen area on either breast.  · You have a fever or chills.  · You have nausea or vomiting.  · You have drainage other than breast milk from your nipples.  · Your breasts do not become full before feedings by the fifth day after you give birth.  · You feel sad and depressed.  · Your baby is too sleepy to eat well.  · Your baby is having trouble sleeping.  · Your baby is wetting less than 3 diapers in a 24-hour period.  · Your baby has less than 3 stools in a 24-hour period.  · Your baby's skin or the white part of his or her eyes becomes yellow.  · Your baby is not gaining weight by 5 days of age.  Get help right away if:  · Your baby is overly tired (lethargic) and does not want to wake up and feed.  · Your baby develops an unexplained fever.  This information is not intended to replace advice given to you by your health care provider. Make sure you discuss any questions you have with your health care provider.  Document Released: 06/23/2005 Document Revised: 12/05/2015 Document Reviewed: 12/15/2012  Elsevier Interactive Patient Education © 2017 [employer]

## 2016-10-01 NOTE — Plan of Care (Signed)
Problem: Coping: Goal: Ability to identify and utilize available resources and services will improve CPS is placing the baby in care of a family member at discharge. Home visit of family member to be investigated this afternoon. Hospital Social Worker states mother can continue to provide care for her newborn as the "Baby Patient" . Discussed with parents the requirements of staying with the newborn in the room at all times. Mother informed she will have to get medications filled and will not have access to medications at the hospital once she is officially discharged. Mother reports understanding of management of care with baby becoming the patient.

## 2016-10-02 ENCOUNTER — Ambulatory Visit: Payer: Self-pay

## 2016-10-02 NOTE — Lactation Note (Signed)
This note was copied from a baby's chart. Lactation Consultation Note  Patient Name: Carmen Martin ElKristin Dhami [id number]'s Date: 10/02/2016  I asked the discharging RN, Vernona RiegerLaura, to let parents know to increase the volumes of the formula feedings (ad lib).   Lurline HareRichey, Jeanelle Dake [facility] 10/02/2016, 2:54 PM

## 2016-10-13 ENCOUNTER — Encounter: Payer: Medicaid Other | Admitting: Obstetrics and Gynecology

## 2016-10-17 ENCOUNTER — Encounter: Payer: Medicaid Other | Admitting: Obstetrics and Gynecology

## 2016-10-22 ENCOUNTER — Encounter: Payer: Medicaid Other | Admitting: Obstetrics and Gynecology

## 2016-10-23 ENCOUNTER — Encounter: Payer: Self-pay | Admitting: Obstetrics and Gynecology

## 2016-10-23 ENCOUNTER — Ambulatory Visit (INDEPENDENT_AMBULATORY_CARE_PROVIDER_SITE_OTHER): Payer: Medicaid Other | Admitting: Obstetrics and Gynecology

## 2016-10-23 NOTE — Progress Notes (Signed)
Patient ID: Carmen Martin, female   DOB: 11-Dec-1995, 21 y.o.   MRN: [id number] Subjective:  Carmen Martin is a 21 y.o. female now 4 weeks 3 days status post C-section. She notes that she obtained the depo-provera injection while in the hospital, but would like long-term birth control methods. Pt reports that her prior birth control methods that she used was OCP and depo-provera injection. Denies any other symptoms. Denies resuming sexual activity at this time.   Review of Systems Negative except    Diet:      Bowel movements : normal.  The patient is not having any pain.  Objective:  BP 118/70 (BP Location: Right Arm, Patient Position: Sitting, Cuff Size: Normal)   Pulse 96   Wt 129 lb (58.5 kg)   BMI 23.59 kg/m  General:Well developed, well nourished.  No acute distress. Abdomen: Bowel sounds normal, soft, non-tender. Pelvic Exam:    External Genitalia:  Normal.    Vagina: Normal    Cervix: Normal    Uterus: Normal, tiny, anterior, upper limits lower size, tilted to right.    Adnexa/Bimanual: Normal  Incision(s):   Healing well, no drainage, no erythema, no hernia, no swelling, no dehiscence,     Assessment:  Post-Op 4 weeks 3 days s/p C-section   Doing well postoperatively.   Plan:  1.Wound care discussed  2. . current medications. 3. Activity restrictions: no bending, stooping, or squatting 4. return to work: not applicable. 5. Follow up in 2 weeks.  By signing my name below, I, Soijett Blue, attest that this documentation has been prepared under the direction and in the presence of Tilda Burrow, MD. Electronically Signed: Soijett Blue, ED Scribe. 10/23/16. 9:41 AM.  I personally performed the services described in this documentation, which was SCRIBED in my presence. The recorded information has been reviewed and considered accurate. It has been edited as necessary during review. Tilda Burrow, MD

## 2016-10-29 ENCOUNTER — Ambulatory Visit (HOSPITAL_COMMUNITY)
Admission: RE | Admit: 2016-10-29 | Discharge: 2016-10-29 | Disposition: A | Discharge status: Home / Self Care | Payer: Medicaid Other | Attending: Psychiatry | Admitting: Psychiatry

## 2016-10-29 DIAGNOSIS — Z833 Family history of diabetes mellitus: Secondary | ICD-10-CM | POA: Insufficient documentation

## 2016-10-29 DIAGNOSIS — F909 Attention-deficit hyperactivity disorder, unspecified type: Secondary | ICD-10-CM | POA: Diagnosis not present

## 2016-10-29 DIAGNOSIS — Z818 Family history of other mental and behavioral disorders: Secondary | ICD-10-CM | POA: Diagnosis not present

## 2016-10-29 DIAGNOSIS — Z9889 Other specified postprocedural states: Secondary | ICD-10-CM | POA: Insufficient documentation

## 2016-10-29 DIAGNOSIS — Z8249 Family history of ischemic heart disease and other diseases of the circulatory system: Secondary | ICD-10-CM | POA: Diagnosis not present

## 2016-10-29 DIAGNOSIS — F419 Anxiety disorder, unspecified: Secondary | ICD-10-CM | POA: Insufficient documentation

## 2016-10-29 DIAGNOSIS — F331 Major depressive disorder, recurrent, moderate: Secondary | ICD-10-CM | POA: Insufficient documentation

## 2016-10-29 DIAGNOSIS — Z823 Family history of stroke: Secondary | ICD-10-CM | POA: Diagnosis not present

## 2016-10-29 DIAGNOSIS — Z888 Allergy status to other drugs, medicaments and biological substances status: Secondary | ICD-10-CM | POA: Insufficient documentation

## 2016-10-29 NOTE — H&P (Signed)
Behavioral Health Medical Screening Exam  Carmen Martin is an 21 y.o. female.  Total Time spent with patient: 15 minutes  Psychiatric Specialty Exam: Physical Exam  Constitutional: She is oriented to person, place, and time. She appears well-developed and well-nourished.  HENT:  Head: Normocephalic.  Right Ear: External ear normal.  Left Ear: External ear normal.  Neck: Normal range of motion.  Cardiovascular: Normal rate, normal heart sounds and intact distal pulses.   Respiratory: Effort normal and breath sounds normal.  GI: Soft. Bowel sounds are normal.  Musculoskeletal: Normal range of motion.  Neurological: She is alert and oriented to person, place, and time.  Skin: Skin is warm and dry.    Review of Systems  Psychiatric/Behavioral: Positive for substance abuse. Negative for depression, hallucinations, memory loss and suicidal ideas. The patient is nervous/anxious. The patient does not have insomnia.     Blood pressure 138/79, pulse (!) 107, temperature 98.9 F (37.2 C), temperature source Oral, resp. rate 18, SpO2 100 %, unknown if currently breastfeeding.There is no height or weight on file to calculate BMI.  General Appearance: Casual and Fairly Groomed  Eye Contact:  Fair  Speech:  Normal Rate  Volume:  Normal  Mood:  Anxious, Depressed, Hopeless and Worthless  Affect:  Congruent, Depressed and Restricted  Thought Process:  Disorganized  Orientation:  Full (Time, Place, and Person)  Thought Content:  Illogical  Suicidal Thoughts:  No  Homicidal Thoughts:  No  Memory:  Immediate;   Good Recent;   Fair Remote;   Fair  Judgement:  Poor  Insight:  Lacking  Psychomotor Activity:  Normal  Concentration: Concentration: Fair and Attention Span: Fair  Recall:  [employer] of Knowledge:Fair  Language: Good  Akathisia:  No  Handed:  Right  AIMS (if indicated):     Assets:  Engineering geologist Vocational/Educational  Sleep:       Musculoskeletal: Strength & Muscle Tone: within normal limits Gait & Station: normal Patient leans: N/A  Blood pressure 138/79, pulse (!) 107, temperature 98.9 F (37.2 C), temperature source Oral, resp. rate 18, SpO2 100 %, unknown if currently breastfeeding.  Recommendations:  Based on my evaluation the patient does not appear to have an emergency medical condition.  Laveda Abbe, NP 10/29/2016, 12:56 PM
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
## 2016-11-06 ENCOUNTER — Encounter: Payer: Self-pay | Admitting: *Deleted

## 2016-11-06 ENCOUNTER — Ambulatory Visit: Payer: Medicaid Other | Admitting: Women's Health

## 2016-11-20 ENCOUNTER — Ambulatory Visit: Payer: Medicaid Other | Admitting: Obstetrics and Gynecology

## 2016-12-03 ENCOUNTER — Encounter: Payer: Self-pay | Admitting: *Deleted

## 2016-12-03 ENCOUNTER — Ambulatory Visit: Payer: Medicaid Other | Admitting: Women's Health

## 2016-12-10 ENCOUNTER — Encounter: Payer: Self-pay | Admitting: *Deleted

## 2016-12-10 ENCOUNTER — Ambulatory Visit: Payer: Medicaid Other | Admitting: Obstetrics and Gynecology

## 2017-01-15 NOTE — Addendum Note (Signed)
Addendum  created 01/15/17 1417 by Tarick Parenteau, MD   Sign clinical note    

## 2017-01-15 NOTE — Anesthesia Postprocedure Evaluation (Signed)
Anesthesia Post Note  Patient: Carmen Martin  Procedure(s) Performed: Procedure(s) (LRB): REPEAT CESAREAN SECTION (N/A)     Anesthesia Post Evaluation  Last Vitals:  Vitals:   09/30/16 1745 10/01/16 0550  BP: 111/62 107/66  Pulse: 91 74  Resp: 18 18  Temp: 36.8 C 36.7 C    Last Pain:  Vitals:   10/01/16 1243  TempSrc:   PainSc: 7                  Phillips Groutarignan, Edelin Fryer

## 2017-11-05 NOTE — Congregational Nurse Program (Signed)
Congregational Nurse Program Note  Date of Encounter: 11/05/2017  Past Medical History: Past Medical History:  Diagnosis Date  . ADHD (attention deficit hyperactivity disorder) 11/02/2012  . Anxiety   . Depression   . Eczema   . Headache(784.0)   . Hx MRSA infection    in buttocks  . Narcotic drug use 2015  . Substance abuse     Encounter Details: CNP Questionnaire - 11/04/17 1145      Questionnaire   Patient Status  Not Applicable    Race  White or Caucasian    Location Patient Served At  [employer], [employer]  Not Applicable    Uninsured  Uninsured (NEW 1x/quarter)    Food  No food insecurities    Housing/Utilities  Yes, have permanent housing    Transportation  No transportation needs    Interpersonal Safety  Yes, feel physically and emotionally safe where you currently live    Medication  No medication insecurities    Medical Provider  No    Referrals  Not Applicable    ED Visit Averted  Not Applicable    Life-Saving Intervention Made  Not Applicable     Seen at the [employer]. No complaints.Given information about [facility]; P [address] RN, [facility] [phone]

## 2018-04-02 ENCOUNTER — Encounter (HOSPITAL_COMMUNITY): Payer: Self-pay | Admitting: Emergency Medicine

## 2018-04-02 ENCOUNTER — Other Ambulatory Visit: Payer: Self-pay

## 2018-04-02 ENCOUNTER — Emergency Department (HOSPITAL_COMMUNITY)
Admission: EM | Admit: 2018-04-02 | Discharge: 2018-04-02 | Disposition: A | Discharge status: Home / Self Care | Payer: Self-pay | Attending: Emergency Medicine | Admitting: Emergency Medicine

## 2018-04-02 DIAGNOSIS — T40601A Poisoning by unspecified narcotics, accidental (unintentional), initial encounter: Secondary | ICD-10-CM

## 2018-04-02 DIAGNOSIS — Y92009 Unspecified place in unspecified non-institutional (private) residence as the place of occurrence of the external cause: Secondary | ICD-10-CM | POA: Insufficient documentation

## 2018-04-02 DIAGNOSIS — Z79899 Other long term (current) drug therapy: Secondary | ICD-10-CM | POA: Insufficient documentation

## 2018-04-02 DIAGNOSIS — Y939 Activity, unspecified: Secondary | ICD-10-CM | POA: Insufficient documentation

## 2018-04-02 DIAGNOSIS — F17210 Nicotine dependence, cigarettes, uncomplicated: Secondary | ICD-10-CM | POA: Insufficient documentation

## 2018-04-02 DIAGNOSIS — Y999 Unspecified external cause status: Secondary | ICD-10-CM | POA: Insufficient documentation

## 2018-04-02 DIAGNOSIS — F1910 Other psychoactive substance abuse, uncomplicated: Secondary | ICD-10-CM | POA: Insufficient documentation

## 2018-04-02 DIAGNOSIS — T507X1A Poisoning by analeptics and opioid receptor antagonists, accidental (unintentional), initial encounter: Secondary | ICD-10-CM | POA: Insufficient documentation

## 2018-04-02 LAB — COMPREHENSIVE METABOLIC PANEL
ALT: 29 U/L (ref 0–44)
AST: 32 U/L (ref 15–41)
Albumin: 3.7 g/dL (ref 3.5–5.0)
Alkaline Phosphatase: 73 U/L (ref 38–126)
Anion gap: 10 (ref 5–15)
BUN: 17 mg/dL (ref 6–20)
CO2: 22 mmol/L (ref 22–32)
CREATININE: 0.79 mg/dL (ref 0.44–1.00)
Calcium: 9.2 mg/dL (ref 8.9–10.3)
Chloride: 105 mmol/L (ref 98–111)
GFR calc Af Amer: >60 mL/min (ref >60)
Glucose, Bld: 101 mg/dL — ABNORMAL HIGH (ref 70–99)
Potassium: 3.7 mmol/L (ref 3.5–5.1)
Sodium: 137 mmol/L (ref 135–145)
TOTAL PROTEIN: 7.4 g/dL (ref 6.5–8.1)
Total Bilirubin: 0.2 mg/dL — ABNORMAL LOW (ref 0.3–1.2)

## 2018-04-02 LAB — ACETAMINOPHEN LEVEL: Acetaminophen (Tylenol), Serum: <10 ug/mL — ABNORMAL LOW (ref 10–30)

## 2018-04-02 LAB — CBC WITH DIFFERENTIAL/PLATELET
BASOS ABS: 0 10*3/uL (ref 0.0–0.1)
BASOS PCT: 0 %
Eosinophils Absolute: 0.1 10*3/uL (ref 0.0–0.7)
Eosinophils Relative: 1 %
HEMATOCRIT: 36.9 % (ref 36.0–46.0)
HEMOGLOBIN: 11.5 g/dL — AB (ref 12.0–15.0)
Lymphocytes Relative: 19 %
Lymphs Abs: 2.6 10*3/uL (ref 0.7–4.0)
MCH: 23.8 pg — ABNORMAL LOW (ref 26.0–34.0)
MCHC: 31.2 g/dL (ref 30.0–36.0)
MCV: 76.2 fL — ABNORMAL LOW (ref 78.0–100.0)
MONO ABS: 0.5 10*3/uL (ref 0.1–1.0)
Monocytes Relative: 4 %
NEUTROS ABS: 10.5 10*3/uL — AB (ref 1.7–7.7)
NEUTROS PCT: 76 %
Platelets: 364 10*3/uL (ref 150–400)
RBC: 4.84 MIL/uL (ref 3.87–5.11)
RDW: 17.4 % — AB (ref 11.5–15.5)
WBC: 13.7 10*3/uL — AB (ref 4.0–10.5)

## 2018-04-02 LAB — URINALYSIS, ROUTINE W REFLEX MICROSCOPIC
BILIRUBIN URINE: NEGATIVE
Glucose, UA: NEGATIVE mg/dL
HGB URINE DIPSTICK: NEGATIVE
KETONES UR: NEGATIVE mg/dL
NITRITE: NEGATIVE
PH: 6 (ref 5.0–8.0)
Protein, ur: 100 mg/dL — AB
SPECIFIC GRAVITY, URINE: 1.016 (ref 1.005–1.030)

## 2018-04-02 LAB — ETHANOL: Alcohol, Ethyl (B): <10 mg/dL (ref <10)

## 2018-04-02 LAB — RAPID URINE DRUG SCREEN, HOSP PERFORMED
Amphetamines: NOT DETECTED
BARBITURATES: NOT DETECTED
Benzodiazepines: POSITIVE — AB
Cocaine: POSITIVE — AB
Opiates: NOT DETECTED
TETRAHYDROCANNABINOL: NOT DETECTED

## 2018-04-02 LAB — CBG MONITORING, ED: Glucose-Capillary: 97 mg/dL (ref 70–99)

## 2018-04-02 LAB — I-STAT BETA HCG BLOOD, ED (MC, WL, AP ONLY)
I-stat hCG, quantitative: <5 m[IU]/mL (ref <5)
I-stat hCG, quantitative: <5 m[IU]/mL (ref <5)

## 2018-04-02 LAB — SALICYLATE LEVEL

## 2018-04-02 MED ORDER — SODIUM CHLORIDE 0.9 % IV BOLUS
1000.0000 mL | Freq: Once | INTRAVENOUS | Status: AC
  Administered 2018-04-02: 1000 mL via INTRAVENOUS

## 2018-04-02 MED ORDER — SODIUM CHLORIDE 0.9 % IV SOLN
INTRAVENOUS | Status: DC
  Administered 2018-04-02: 17:00:00 via INTRAVENOUS

## 2018-04-02 NOTE — ED Provider Notes (Signed)
[facility] EMERGENCY DEPARTMENT Provider Note   CSN: [id number] Arrival date & time: 04/02/18  1455     History   Chief Complaint Chief Complaint  Patient presents with  . Drug Overdose    HPI Carmen Martin is a 22 y.o. female.  Pt presents to the ED today as a drug overdose.  Someone at pt's house called 911 for an overdose around 1:45 pm.  EMS arrived and found pt unresponsive.  She was given narcan between 1:45 pm and 2 pm.   We don't know how much or what route.  Pt woke up and became agitated and refused to go with them.  The police also responded to the call and arrested her for outstanding warrants.  When they arrived at the station, pt became unresponsive again, so they brought her here.  Pt is unable to give any hx.  However, when an IV was attempted, pt awoke and said she could do a better job at finding a vein, then went back to sleep.  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     Past Medical History:  Diagnosis Date  . ADHD (attention deficit hyperactivity disorder) 11/02/2012  . Anxiety   . Depression   . Eczema   . Headache(784.0)   . Hx MRSA infection    in buttocks  . Narcotic drug use 2015  . Substance abuse [facility])     Patient Active Problem List   Diagnosis Date Noted  . Status post repeat low transverse cesarean section 09/28/2016  . Intrauterine growth restriction affecting antepartum care of mother in third trimester 09/09/2016  . Limited prenatal care 09/04/2016  . Supervision of high risk pregnancy, antepartum 05/21/2016  . Status post cesarean section 07/08/2014  . History of prior pregnancy with IUGR newborn   . Narcotic abuse (HCC) 07/06/2014  . Long term prescription benzodiazepine use 05/18/2014  . Late prenatal care in second trimester March 25, 2014  . History of intrauterine fetal death, currently pregnant 03-25-14  . ADHD (attention deficit hyperactivity disorder) 11/02/2012  . Anxiety state, unspecified 09/22/2012  . Depression 09/22/2012      Past Surgical History:  Procedure Laterality Date  . CESAREAN SECTION N/A 07/08/2014   Procedure: CESAREAN SECTION;  Surgeon: Tilda Burrow, MD;  Location: [facility];  Service: Obstetrics;  Laterality: N/A;  . CESAREAN SECTION N/A 09/28/2016   Procedure: REPEAT CESAREAN SECTION;  Surgeon: Tilda Burrow, MD;  Location: [facility] BIRTHING SUITES;  Service: Obstetrics;  Laterality: N/A;  . COLONOSCOPY WITH PROPOFOL N/A 09/29/2012   Procedure: COLONOSCOPY WITH PROPOFOL;  Surgeon: Malissa Hippo, MD;  Location: [facility]S;  Service: Endoscopy;  Laterality: N/A;  at cecum at 0902 total withdrawal time  . WISDOM TOOTH EXTRACTION  2014     OB History    Gravida  4   Para  3   Term  1   Preterm  2   AB  1   Living  2     SAB  1   TAB  0   Ectopic  0   Multiple  0   Live Births  2            Home Medications    Prior to Admission medications   Medication Sig Start Date End Date Taking? Authorizing Provider  alprazolam Prudy Feeler) 2 MG tablet Take 2 mg by mouth 2 (two) times daily.    [provider]  amphetamine-dextroamphetamine (ADDERALL XR) 15 MG 24 hr capsule  Take 15 mg by mouth every morning.    [provider]    Family History Family History  Problem Relation Age of Onset  . Diabetes Mother   . Heart disease Mother   . Stroke Mother   . Hypertension Mother   . Depression Father   . Cancer Maternal Grandmother        breast  . Alzheimer's disease Paternal Grandmother   . Stroke Paternal Grandfather     Social History Social History   Tobacco Use  . Smoking status: Current Some Day Smoker    Packs/day: 0.25    Years: 4.00    Pack years: 1.00    Types: Cigarettes  . Smokeless tobacco: Never Used  . Tobacco comment: pt states less than 1/2 pack cig per week  Substance Use Topics  . Alcohol use: No  . Drug use: Yes     Allergies   Biaxin [clarithromycin] and Vicodin [hydrocodone-acetaminophen]   Review of Systems Review of  Systems  Unable to perform ROS: Mental status change     Physical Exam Updated Vital Signs BP 91/67   Pulse 69   Temp 98.3 F (36.8 C) (Axillary)   Resp 15   Wt 58.5 kg   LMP 01/17/2018   SpO2 99%   BMI 23.59 kg/m   Physical Exam  Constitutional: She appears well-developed and well-nourished. She appears lethargic.  HENT:  Head: Normocephalic and atraumatic.  Right Ear: External ear normal.  Left Ear: External ear normal.  Nose: Nose normal.  Mouth/Throat: Oropharynx is clear and moist.  Eyes: Pupils are equal, round, and reactive to light. Conjunctivae and EOM are normal.  Neck: Normal range of motion. Neck supple.  Cardiovascular: Regular rhythm, normal heart sounds and intact distal pulses. Tachycardia present.  Pulmonary/Chest: Effort normal and breath sounds normal.  Abdominal: Soft. Bowel sounds are normal.  Musculoskeletal: Normal range of motion.  Neurological: She appears lethargic.  Pt is moving all 4 extremities.  She is somnolent, but awoke to IV placement.  She will not follow commands.    Skin: Skin is warm. Capillary refill takes less than 2 seconds.  Psychiatric:  Unable to assess due to overdose.  Nursing note and vitals reviewed.    ED Treatments / Results  Labs (all labs ordered are listed, but only abnormal results are displayed) Labs Reviewed  COMPREHENSIVE METABOLIC PANEL - Abnormal; Notable for the following components:      Result Value   Glucose, Bld 101 (*)    Total Bilirubin 0.2 (*)    All other components within normal limits  ACETAMINOPHEN LEVEL - Abnormal; Notable for the following components:   Acetaminophen (Tylenol), Serum <10 (*)    All other components within normal limits  RAPID URINE DRUG SCREEN, HOSP PERFORMED - Abnormal; Notable for the following components:   Cocaine POSITIVE (*)    Benzodiazepines POSITIVE (*)    All other components within normal limits  CBC WITH DIFFERENTIAL/PLATELET - Abnormal; Notable for the  following components:   WBC 13.7 (*)    Hemoglobin 11.5 (*)    MCV 76.2 (*)    MCH 23.8 (*)    RDW 17.4 (*)    Neutro Abs 10.5 (*)    All other components within normal limits  URINALYSIS, ROUTINE W REFLEX MICROSCOPIC - Abnormal; Notable for the following components:   APPearance HAZY (*)    Protein, ur 100 (*)    Leukocytes, UA TRACE (*)    Bacteria, UA RARE (*)  All other components within normal limits  SALICYLATE LEVEL  ETHANOL  CBG MONITORING, ED  I-STAT BETA HCG BLOOD, ED ([facility], [facility], [facility] ONLY)  I-STAT BETA HCG BLOOD, ED ([facility], [facility], [facility] ONLY)    EKG EKG Interpretation  Date/Time:  Friday April 02 2018 15:03:49 EDT Ventricular Rate:  99 PR Interval:    QRS Duration: 73 QT Interval:  359 QTC Calculation: 461 R Axis:   82 Text Interpretation:  Sinus rhythm Right atrial enlargement No significant change since last tracing Confirmed by Jacalyn Lefevre [phone]) on 04/02/2018 3:17:36 PM   Radiology No results found.  Procedures Procedures (including critical care time)  Medications Ordered in ED Medications  sodium chloride 0.9 % bolus 1,000 mL (0 mLs Intravenous Stopped 04/02/18 1625)    And  0.9 %  sodium chloride infusion ( Intravenous New Bag/Given 04/02/18 1650)     Initial Impression / Assessment and Plan / ED Course  I have reviewed the triage vital signs and the nursing notes.  Pertinent labs & imaging results that were available during my care of the patient were reviewed by me and considered in my medical decision making (see chart for details).    Pt has been observed for several hours, and she is awake and alert.  She is medically clear to go to jail.  Final Clinical Impressions(s) / ED Diagnoses   Final diagnoses:  Polysubstance abuse (HCC)  Opiate overdose, accidental or unintentional, initial encounter [facility])    ED Discharge Orders    None       Jacalyn Lefevre, MD 04/02/18 2115

## 2018-04-02 NOTE — ED Triage Notes (Signed)
Overdose on heroin.  Given narcan by first responders.  Pt is awake but lethargic at this time.

## 2018-10-08 ENCOUNTER — Emergency Department (HOSPITAL_COMMUNITY)
Admission: EM | Admit: 2018-10-08 | Discharge: 2018-10-08 | Discharge status: Against Medical Advice | Payer: Medicaid Other | Attending: Emergency Medicine | Admitting: Emergency Medicine

## 2018-10-08 ENCOUNTER — Encounter: Payer: Self-pay | Admitting: Emergency Medicine

## 2018-10-08 ENCOUNTER — Other Ambulatory Visit: Payer: Self-pay

## 2018-10-08 DIAGNOSIS — Z79899 Other long term (current) drug therapy: Secondary | ICD-10-CM | POA: Insufficient documentation

## 2018-10-08 DIAGNOSIS — T401X1A Poisoning by heroin, accidental (unintentional), initial encounter: Secondary | ICD-10-CM

## 2018-10-08 DIAGNOSIS — F17210 Nicotine dependence, cigarettes, uncomplicated: Secondary | ICD-10-CM | POA: Insufficient documentation

## 2018-10-08 LAB — CBG MONITORING, ED: Glucose-Capillary: 208 mg/dL — ABNORMAL HIGH (ref 70–99)

## 2018-10-08 MED ORDER — NALOXONE HCL 2 MG/2ML IJ SOSY
2.0000 mg | PREFILLED_SYRINGE | INTRAMUSCULAR | Status: DC | PRN
  Administered 2018-10-08: 2 mg via NASAL

## 2018-10-08 MED ORDER — NALOXONE HCL 4 MG/0.1ML NA LIQD: 1.0000 | Freq: Once | NASAL | Status: DC

## 2018-10-08 MED ORDER — NALOXONE HCL 4 MG/0.1ML NA LIQD: NASAL | 0 refills | Status: DC
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
## 2018-10-08 NOTE — ED Notes (Signed)
Pt told this RN she was ready to leave AMA. This RN told EDP who stated she could leave AMA. Pt signed AMA form but wouldn't wait on instructions

## 2018-10-08 NOTE — ED Provider Notes (Signed)
[place] [facility] EMERGENCY DEPARTMENT Provider Note   CSN: [id number] Arrival date & time: 10/08/18  2211    History   Chief Complaint Chief Complaint  Patient presents with  . Drug Overdose    HPI Carmen Martin is a 23 y.o. female.  Patient was dropped off out of a car unresponsive.  She was immediately brought back to the resuscitation room and given 2 mg intranasal Narcan.  She was still unresponsive and was bag ventilated until she was more alert.  Patient is now awake and tearful.  She admits to overdose on heroin.   level 5 caveat secondary to altered mental status  The history is provided by the patient.  Drug Overdose  This is a recurrent problem. The problem has not changed since onset.Nothing aggravates the symptoms. Nothing relieves the symptoms. She has tried nothing for the symptoms. The treatment provided no relief.    Past Medical History:  Diagnosis Date  . ADHD (attention deficit hyperactivity disorder) 11/02/2012  . Anxiety   . Depression   . Eczema   . Headache(784.0)   . Hx MRSA infection    in buttocks  . Narcotic drug use 2015  . Substance abuse [facility])     Patient Active Problem List   Diagnosis Date Noted  . Status post repeat low transverse cesarean section 09/28/2016  . Intrauterine growth restriction affecting antepartum care of mother in third trimester 09/09/2016  . Limited prenatal care 09/04/2016  . Supervision of high risk pregnancy, antepartum 05/21/2016  . Status post cesarean section 07/08/2014  . History of prior pregnancy with IUGR newborn   . Narcotic abuse (HCC) 07/06/2014  . Long term prescription benzodiazepine use 05/18/2014  . Late prenatal care in second trimester 2014/04/03  . History of intrauterine fetal death, currently pregnant 04-03-14  . ADHD (attention deficit hyperactivity disorder) 11/02/2012  . Anxiety state, unspecified 09/22/2012  . Depression 09/22/2012    Past Surgical History:  Procedure Laterality Date  .  CESAREAN SECTION N/A 07/08/2014   Procedure: CESAREAN SECTION;  Surgeon: Tilda Burrow, MD;  Location: [facility] ORS;  Service: Obstetrics;  Laterality: N/A;  . CESAREAN SECTION N/A 09/28/2016   Procedure: REPEAT CESAREAN SECTION;  Surgeon: Tilda Burrow, MD;  Location: [facility] BIRTHING SUITES;  Service: Obstetrics;  Laterality: N/A;  . COLONOSCOPY WITH PROPOFOL N/A 09/29/2012   Procedure: COLONOSCOPY WITH PROPOFOL;  Surgeon: Malissa Hippo, MD;  Location: [facility] ORS;  Service: Endoscopy;  Laterality: N/A;  at cecum at 0902 total withdrawal time  . WISDOM TOOTH EXTRACTION  2014     OB History    Gravida  4   Para  3   Term  1   Preterm  2   AB  1   Living  2     SAB  1   TAB  0   Ectopic  0   Multiple  0   Live Births  2            Home Medications    Prior to Admission medications   Medication Sig Start Date End Date Taking? Authorizing Provider  alprazolam Prudy Feeler) 2 MG tablet Take 2 mg by mouth 2 (two) times daily.    [provider]  amphetamine-dextroamphetamine (ADDERALL XR) 15 MG 24 hr capsule Take 15 mg by mouth every morning.    [provider]    Family History Family History  Problem Relation Age of Onset  . Diabetes Mother   . Heart  disease Mother   . Stroke Mother   . Hypertension Mother   . Depression Father   . Cancer Maternal Grandmother        breast  . Alzheimer's disease Paternal Grandmother   . Stroke Paternal Grandfather     Social History Social History   Tobacco Use  . Smoking status: Current Some Day Smoker    Packs/day: 0.25    Years: 4.00    Pack years: 1.00    Types: Cigarettes  . Smokeless tobacco: Never Used  . Tobacco comment: pt states less than 1/2 pack cig per week  Substance Use Topics  . Alcohol use: No  . Drug use: Yes     Allergies   Biaxin [clarithromycin] and Vicodin [hydrocodone-acetaminophen]   Review of Systems Review of Systems  Unable to perform ROS: Acuity of condition      Physical Exam Updated Vital Signs BP (!) 117/58 (BP Location: Left Arm)   Pulse (!) 120   Resp 19   SpO2 100%   Physical Exam Vitals signs and nursing note reviewed.  Constitutional:      General: She is not in acute distress.    Appearance: She is well-developed.  HENT:     Head: Normocephalic and atraumatic.  Eyes:     Conjunctiva/sclera: Conjunctivae normal.  Neck:     Musculoskeletal: Neck supple.  Cardiovascular:     Rate and Rhythm: Regular rhythm. Tachycardia present.     Heart sounds: No murmur.  Pulmonary:     Effort: Pulmonary effort is normal. No respiratory distress.     Breath sounds: Normal breath sounds.  Abdominal:     Palpations: Abdomen is soft.     Tenderness: There is no abdominal tenderness.  Musculoskeletal: Normal range of motion.  Skin:    General: Skin is warm and dry.     Capillary Refill: Capillary refill takes less than 2 seconds.  Neurological:     General: No focal deficit present.     Mental Status: She is alert.      ED Treatments / Results  Labs (all labs ordered are listed, but only abnormal results are displayed) Labs Reviewed  CBG MONITORING, ED - Abnormal; Notable for the following components:      Result Value   Glucose-Capillary 208 (*)    All other components within normal limits  CBG MONITORING, ED    EKG None  Radiology No results found.  Procedures Procedures (including critical care time)  Medications Ordered in ED Medications  naloxone [facility]) injection 2 mg (2 mg Nasal Given 10/08/18 2218)  naloxone (NARCAN) nasal spray 4 mg/0.1 mL (has no administration in time range)     Initial Impression / Assessment and Plan / ED Course  I have reviewed the triage vital signs and the nursing notes.  Pertinent labs & imaging results that were available during my care of the patient were reviewed by me and considered in my medical decision making (see chart for details).  Clinical Course as of Oct 08 2343  Fri Oct 08, 2018  2230 EKG sinus tachycardia rate of 142 nonspecific ST-T changes.   [MB]  2230 Patient required a few minutes of rescue breathing until she became more alert and off.  Now she is tachycardic but satting well on room air.  She is alert.  We will continue to monitor.   [MB]  2255 Patient reevaluated.  Tachycardia somewhat improved.  She remains alert.  Likely will be discharged after period  of observation to make sure that she does not become re-sedated as the Narcan wears off.   [MB]  2307 Was informed from the nurses that the patient wishes to sign out AMA.  She is awake and alert and able to make decisions.  She understands the risk of becoming more somnolent as the Narcan wears off.  Despite that she wishes to leave.   [MB]    Clinical Course User Index [MB] Terrilee FilesButler,  C, MD       Final Clinical Impressions(s) / ED Diagnoses   Final diagnoses:  Accidental overdose of heroin, initial encounter [facility](HCC)    ED Discharge Orders    None       Terrilee FilesButler,  C, MD 10/08/18 [phone]

## 2018-10-08 NOTE — Discharge Instructions (Signed)
You were seen in the emergency department for being unresponsive after heroin use.  You were not breathing at all by the time you got here.  You were given Narcan which helped wake you up.  Will be important for you to consider getting help with your heroin abuse.

## 2019-12-06 DEATH — deceased
